# Patient Record
Sex: Female | Born: 1980 | Race: White | Hispanic: No | Smoking: Never smoker
Health system: Southern US, Community
[De-identification: ages and names within clinical notes are randomized; demographics above are authoritative.]

## PROBLEM LIST (undated history)

## (undated) DIAGNOSIS — IMO0002 Reserved for concepts with insufficient information to code with codable children: Secondary | ICD-10-CM

## (undated) DIAGNOSIS — Z789 Other specified health status: Secondary | ICD-10-CM

## (undated) DIAGNOSIS — M519 Unspecified thoracic, thoracolumbar and lumbosacral intervertebral disc disorder: Secondary | ICD-10-CM

## (undated) DIAGNOSIS — R87619 Unspecified abnormal cytological findings in specimens from cervix uteri: Secondary | ICD-10-CM

## (undated) HISTORY — DX: Other specified health status: Z78.9

## (undated) HISTORY — DX: Unspecified abnormal cytological findings in specimens from cervix uteri: R87.619

## (undated) HISTORY — DX: Reserved for concepts with insufficient information to code with codable children: IMO0002

## (undated) HISTORY — DX: Unspecified thoracic, thoracolumbar and lumbosacral intervertebral disc disorder: M51.9

## (undated) HISTORY — PX: LEEP: SHX91

---

## 2005-11-28 ENCOUNTER — Other Ambulatory Visit: Admission: RE | Admit: 2005-11-28 | Discharge: 2005-11-28 | Payer: Self-pay | Admitting: Obstetrics and Gynecology

## 2006-05-18 ENCOUNTER — Ambulatory Visit: Payer: Self-pay | Admitting: Internal Medicine

## 2007-01-19 ENCOUNTER — Ambulatory Visit: Payer: Self-pay | Admitting: Endocrinology

## 2007-05-19 ENCOUNTER — Encounter: Payer: Self-pay | Admitting: *Deleted

## 2007-05-19 DIAGNOSIS — F411 Generalized anxiety disorder: Secondary | ICD-10-CM

## 2007-05-19 DIAGNOSIS — N390 Urinary tract infection, site not specified: Secondary | ICD-10-CM | POA: Insufficient documentation

## 2007-06-03 ENCOUNTER — Encounter: Payer: Self-pay | Admitting: Internal Medicine

## 2007-07-16 ENCOUNTER — Telehealth (INDEPENDENT_AMBULATORY_CARE_PROVIDER_SITE_OTHER): Payer: Self-pay | Admitting: *Deleted

## 2007-07-20 ENCOUNTER — Telehealth (INDEPENDENT_AMBULATORY_CARE_PROVIDER_SITE_OTHER): Payer: Self-pay | Admitting: *Deleted

## 2007-07-20 ENCOUNTER — Ambulatory Visit: Payer: Self-pay | Admitting: Internal Medicine

## 2007-07-20 DIAGNOSIS — G4710 Hypersomnia, unspecified: Secondary | ICD-10-CM

## 2007-07-20 DIAGNOSIS — R079 Chest pain, unspecified: Secondary | ICD-10-CM

## 2007-07-20 DIAGNOSIS — R002 Palpitations: Secondary | ICD-10-CM | POA: Insufficient documentation

## 2007-07-20 DIAGNOSIS — R5383 Other fatigue: Secondary | ICD-10-CM

## 2007-07-20 DIAGNOSIS — R5381 Other malaise: Secondary | ICD-10-CM | POA: Insufficient documentation

## 2007-07-20 LAB — CONVERTED CEMR LAB
AST: 21 units/L (ref 0–37)
Albumin: 4.1 g/dL (ref 3.5–5.2)
Bilirubin, Direct: 0.1 mg/dL (ref 0.0–0.3)
Calcium: 9.5 mg/dL (ref 8.4–10.5)
Chloride: 106 meq/L (ref 96–112)
Eosinophils Absolute: 0.1 10*3/uL (ref 0.0–0.6)
GFR calc Af Amer: 130 mL/min
GFR calc non Af Amer: 108 mL/min
Lymphocytes Relative: 54.9 % — ABNORMAL HIGH (ref 12.0–46.0)
MCV: 90.1 fL (ref 78.0–100.0)
Monocytes Relative: 8.6 % (ref 3.0–11.0)
Platelets: 242 10*3/uL (ref 150–400)
RBC: 4.17 M/uL (ref 3.87–5.11)
Sodium: 140 meq/L (ref 135–145)
Total Bilirubin: 1 mg/dL (ref 0.3–1.2)
WBC: 4.2 10*3/uL — ABNORMAL LOW (ref 4.5–10.5)

## 2007-07-27 ENCOUNTER — Encounter: Payer: Self-pay | Admitting: Internal Medicine

## 2007-07-27 ENCOUNTER — Ambulatory Visit: Payer: Self-pay

## 2007-08-17 ENCOUNTER — Telehealth: Payer: Self-pay | Admitting: Endocrinology

## 2007-08-17 ENCOUNTER — Telehealth (INDEPENDENT_AMBULATORY_CARE_PROVIDER_SITE_OTHER): Payer: Self-pay | Admitting: *Deleted

## 2008-07-09 ENCOUNTER — Ambulatory Visit: Payer: Self-pay | Admitting: Internal Medicine

## 2008-07-09 LAB — CONVERTED CEMR LAB
Bilirubin Urine: NEGATIVE
Crystals: NEGATIVE
Leukocytes, UA: NEGATIVE
Mucus, UA: NEGATIVE
Specific Gravity, Urine: 1.02 (ref 1.000–1.03)
Urine Glucose: NEGATIVE mg/dL
pH: 6.5 (ref 5.0–8.0)

## 2008-07-10 ENCOUNTER — Encounter: Payer: Self-pay | Admitting: Internal Medicine

## 2008-08-11 ENCOUNTER — Telehealth (INDEPENDENT_AMBULATORY_CARE_PROVIDER_SITE_OTHER): Payer: Self-pay | Admitting: *Deleted

## 2009-01-22 ENCOUNTER — Telehealth: Payer: Self-pay | Admitting: Internal Medicine

## 2009-02-01 IMAGING — CR DG CHEST 2V
2 series · 2 of 2 positions shown · non-contrast
Comparison: None.

CLINICAL DATA: 26-year-old with chest pain. 
 CHEST - 2 VIEW:

[view not recorded (1 of 2)]
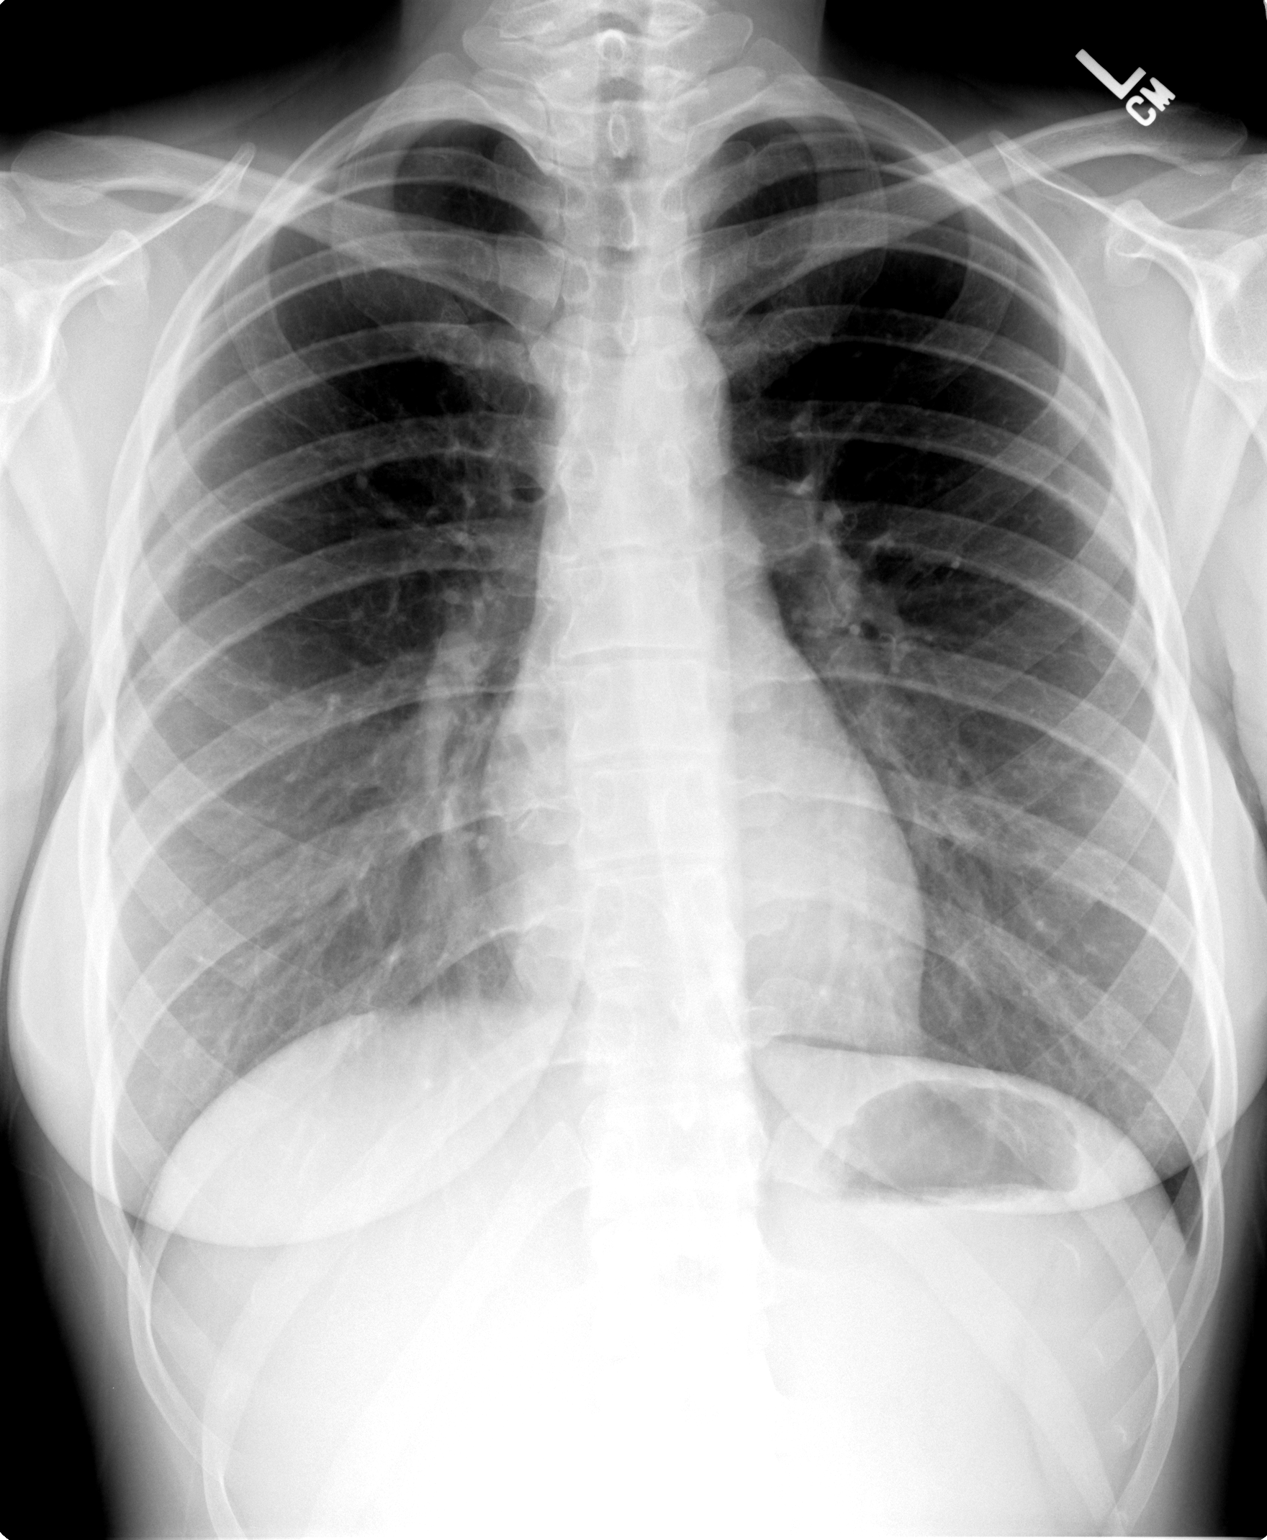

[view not recorded (2 of 2)]
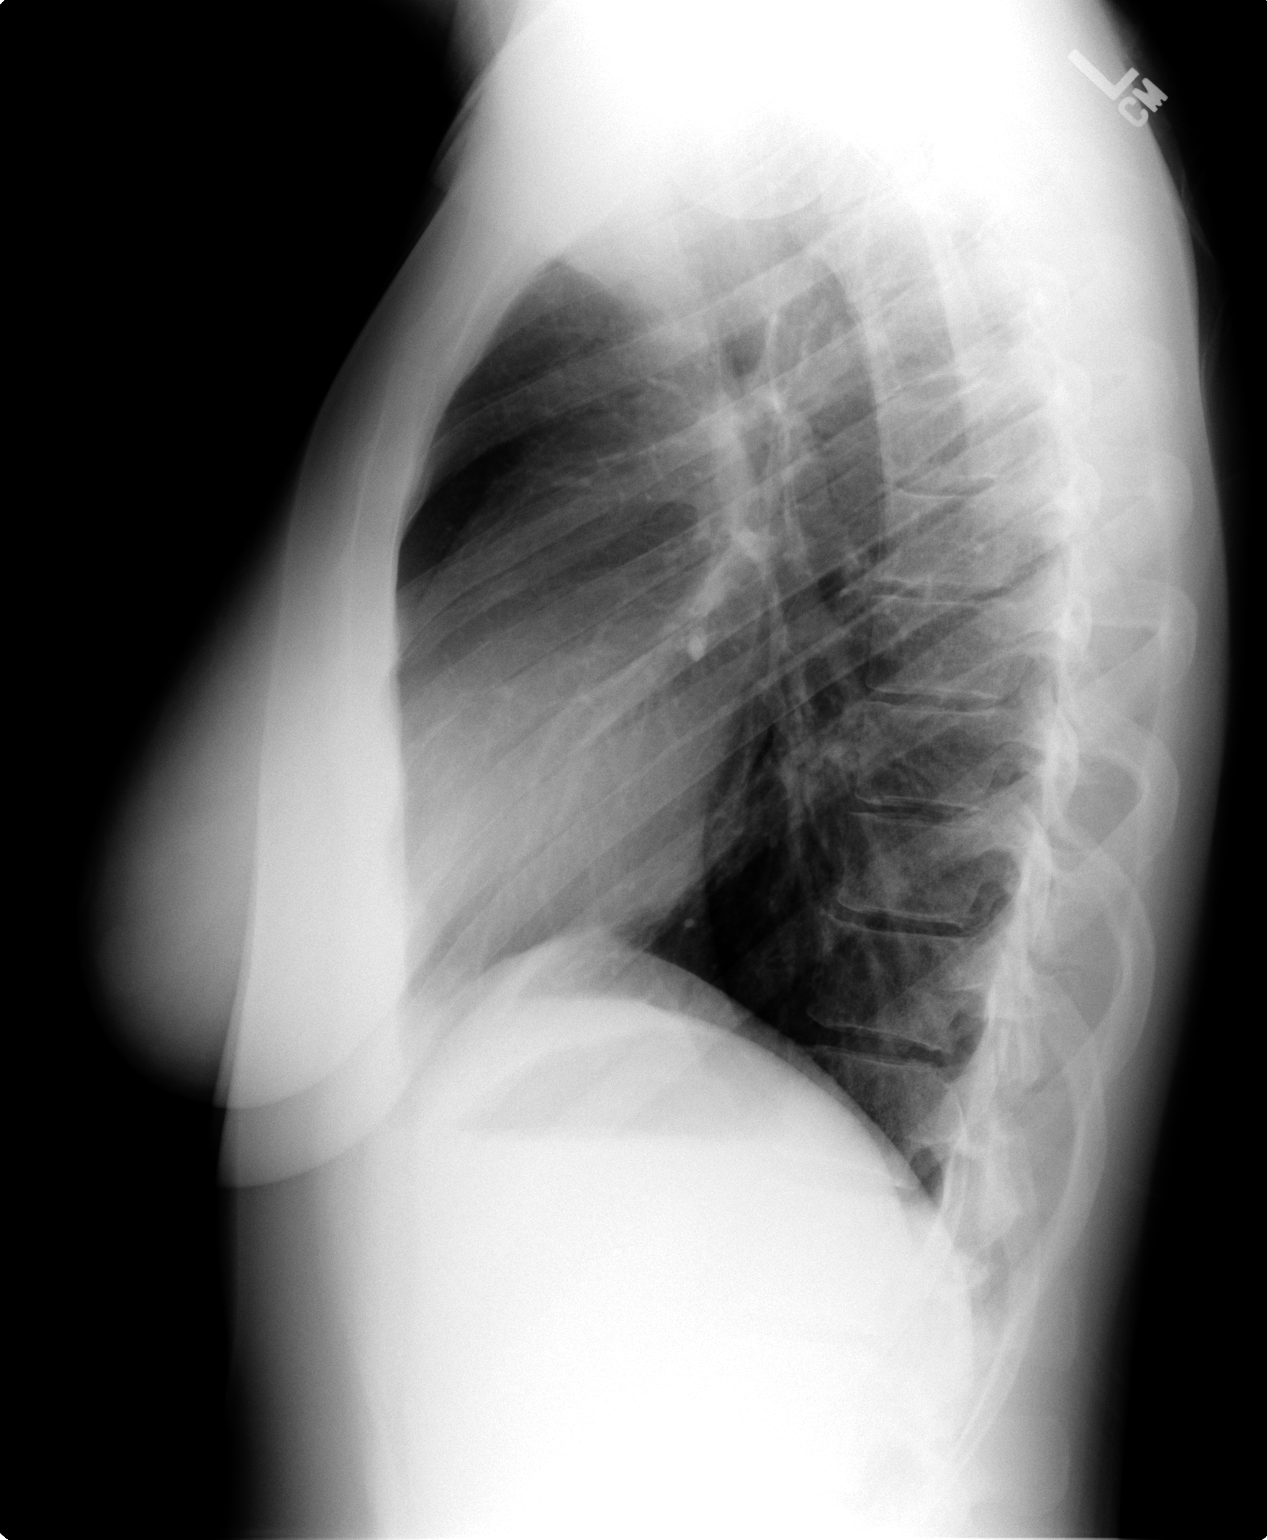

[2 of 2 positions shown; findings below may reference images not displayed]

FINDINGS: The heart size and mediastinal contours are within normal limits.  Both lungs are clear.  The visualized skeletal structures are unremarkable.
IMPRESSION: No active cardiopulmonary disease.

## 2009-08-14 ENCOUNTER — Ambulatory Visit: Payer: Self-pay | Admitting: Internal Medicine

## 2009-08-14 DIAGNOSIS — F988 Other specified behavioral and emotional disorders with onset usually occurring in childhood and adolescence: Secondary | ICD-10-CM | POA: Insufficient documentation

## 2009-08-14 LAB — CONVERTED CEMR LAB
AST: 24 units/L (ref 0–37)
Albumin: 4.2 g/dL (ref 3.5–5.2)
BUN: 6 mg/dL (ref 6–23)
Basophils Relative: 1.6 % (ref 0.0–3.0)
CO2: 29 meq/L (ref 19–32)
Calcium: 9.6 mg/dL (ref 8.4–10.5)
Chloride: 104 meq/L (ref 96–112)
Cholesterol: 195 mg/dL (ref 0–200)
Eosinophils Relative: 1.5 % (ref 0.0–5.0)
HCT: 40.6 % (ref 36.0–46.0)
HDL: 52 mg/dL (ref >39.00)
Hemoglobin: 13.6 g/dL (ref 12.0–15.0)
Lymphocytes Relative: 49.7 % — ABNORMAL HIGH (ref 12.0–46.0)
Lymphs Abs: 1.9 10*3/uL (ref 0.7–4.0)
Neutro Abs: 1.5 10*3/uL (ref 1.4–7.7)
Neutrophils Relative %: 36.8 % — ABNORMAL LOW (ref 43.0–77.0)
Nitrite: NEGATIVE
RDW: 11.6 % (ref 11.5–14.6)
Sodium: 138 meq/L (ref 135–145)
Specific Gravity, Urine: 1.02 (ref 1.000–1.030)
Total CHOL/HDL Ratio: 4
Total Protein, Urine: NEGATIVE mg/dL
Urine Glucose: NEGATIVE mg/dL
VLDL: 9.8 mg/dL (ref 0.0–40.0)
WBC: 4 10*3/uL — ABNORMAL LOW (ref 4.5–10.5)
pH: 6 (ref 5.0–8.0)

## 2009-08-17 ENCOUNTER — Encounter: Payer: Self-pay | Admitting: Internal Medicine

## 2009-08-17 ENCOUNTER — Telehealth: Payer: Self-pay | Admitting: Internal Medicine

## 2009-09-11 ENCOUNTER — Telehealth: Payer: Self-pay | Admitting: Internal Medicine

## 2009-10-16 ENCOUNTER — Telehealth: Payer: Self-pay | Admitting: Internal Medicine

## 2010-01-19 ENCOUNTER — Telehealth: Payer: Self-pay | Admitting: Internal Medicine

## 2010-04-20 ENCOUNTER — Telehealth: Payer: Self-pay | Admitting: Internal Medicine

## 2010-04-24 ENCOUNTER — Emergency Department (HOSPITAL_COMMUNITY): Admission: EM | Admit: 2010-04-24 | Discharge: 2010-04-24 | Payer: Self-pay | Admitting: Emergency Medicine

## 2010-07-16 ENCOUNTER — Ambulatory Visit: Payer: Self-pay | Admitting: Internal Medicine

## 2010-07-16 DIAGNOSIS — R1013 Epigastric pain: Secondary | ICD-10-CM

## 2010-08-25 ENCOUNTER — Encounter: Payer: Self-pay | Admitting: Internal Medicine

## 2010-09-17 LAB — HEPATITIS B SURFACE ANTIGEN: Hepatitis B Surface Ag: NEGATIVE

## 2010-09-17 LAB — RUBELLA ANTIBODY, IGM: Rubella: IMMUNE

## 2010-09-17 LAB — RPR: RPR: NONREACTIVE

## 2010-09-28 NOTE — Progress Notes (Signed)
Summary: Adderall  Phone Note Call from Patient Call back at Home Phone [phone]   Caller: Patient Summary of Call: pt called requesting 3 mth refill of Adderall. Pt informed MD out of office, will wait until tomorrow. Initial call taken by: Margaret Pyle, CMA,  Jan 19, 2010 1:13 PM    New/Updated Medications: ADDERALL XR 20 MG XR24H-CAP (AMPHETAMINE-DEXTROAMPHETAMINE) 1 by mouth once daily - to fill Jan 19, 2010 ADDERALL XR 20 MG XR24H-CAP (AMPHETAMINE-DEXTROAMPHETAMINE) 1 by mouth once daily - to fill February 18, 2010 ADDERALL XR 20 MG XR24H-CAP (AMPHETAMINE-DEXTROAMPHETAMINE) 1 by mouth once daily - to fill March 20, 2010 Prescriptions: ADDERALL XR 20 MG XR24H-CAP (AMPHETAMINE-DEXTROAMPHETAMINE) 1 by mouth once daily - to fill March 20, 2010  #30 x 0   Entered and Authorized by:   Corwin Levins MD   Signed by:   Corwin Levins MD on 01/19/2010   Method used:   Print then Give to Patient   RxID:   [id number] ADDERALL XR 20 MG XR24H-CAP (AMPHETAMINE-DEXTROAMPHETAMINE) 1 by mouth once daily - to fill February 18, 2010  #30 x 0   Entered and Authorized by:   Corwin Levins MD   Signed by:   Corwin Levins MD on 01/19/2010   Method used:   Print then Give to Patient   RxID:   [id number] ADDERALL XR 20 MG XR24H-CAP (AMPHETAMINE-DEXTROAMPHETAMINE) 1 by mouth once daily - to fill Jan 19, 2010  #30 x 0   Entered and Authorized by:   Corwin Levins MD   Signed by:   Corwin Levins MD on 01/19/2010   Method used:   Print then Give to Patient   RxID:   [id number]  done hardcopy to LIM side B - dahlia Corwin Levins MD  Jan 19, 2010 2:11 PM   pt informed, Rx in cabinet for pt pick up Margaret Pyle, CMA  Jan 19, 2010 2:47 PM

## 2010-09-28 NOTE — Progress Notes (Signed)
Summary: Adderall  Phone Note Call from Patient Call back at Home Phone [phone]   Caller: Patient Summary of Call: pt called requesting refills of Adderall ER supply Initial call taken by: Margaret Pyle, CMA,  October 16, 2009 1:25 PM  Follow-up for Phone Call        done hardcopy to LIM side B - dahlia  - x 3 mo Follow-up by: Corwin Levins MD,  October 16, 2009 1:29 PM  Additional Follow-up for Phone Call Additional follow up Details #1::        pt informed via VM, told to call back if needed, rx in cabinet for pick up Additional Follow-up by: Margaret Pyle, CMA,  October 16, 2009 1:49 PM    New/Updated Medications: ADDERALL XR 20 MG XR24H-CAP (AMPHETAMINE-DEXTROAMPHETAMINE) 1 by mouth once daily - to fill Oct 15, 2009 ADDERALL XR 20 MG XR24H-CAP (AMPHETAMINE-DEXTROAMPHETAMINE) 1 by mouth once daily - to fill Nov 14, 2009 ADDERALL XR 20 MG XR24H-CAP (AMPHETAMINE-DEXTROAMPHETAMINE) 1 by mouth once daily - to fill Dec 14, 2009 Prescriptions: ADDERALL XR 20 MG XR24H-CAP (AMPHETAMINE-DEXTROAMPHETAMINE) 1 by mouth once daily - to fill Dec 14, 2009  #30 x 0   Entered and Authorized by:   Corwin Levins MD   Signed by:   Corwin Levins MD on 10/16/2009   Method used:   Print then Give to Patient   RxID:   [id number] ADDERALL XR 20 MG XR24H-CAP (AMPHETAMINE-DEXTROAMPHETAMINE) 1 by mouth once daily - to fill Nov 14, 2009  #30 x 0   Entered and Authorized by:   Corwin Levins MD   Signed by:   Corwin Levins MD on 10/16/2009   Method used:   Print then Give to Patient   RxID:   [id number] ADDERALL XR 20 MG XR24H-CAP (AMPHETAMINE-DEXTROAMPHETAMINE) 1 by mouth once daily - to fill Oct 15, 2009  #30 x 0   Entered and Authorized by:   Corwin Levins MD   Signed by:   Corwin Levins MD on 10/16/2009   Method used:   Print then Give to Patient   RxID:   [id number]

## 2010-09-28 NOTE — Progress Notes (Signed)
Summary: Med refill  Phone Note Refill Request Message from:  Patient on September 11, 2009 4:10 PM  Refills Requested: Medication #1:  ADDERALL XR 20 MG XR24H-CAP 1 by mouth once daily.   Dosage confirmed as above?Dosage Confirmed Pt states she was supposed to call back and let MD know if Medication helped pt. Pt states it is going well and "helps her out alot". She would like a refill sent into [facility] on [address]? Please advise?  Initial call taken by: Josph Macho CMA,  September 11, 2009 4:11 PM  Follow-up for Phone Call        done hardcopy to LIM side B - dahlia  Follow-up by: Corwin Levins MD,  September 11, 2009 5:48 PM  Additional Follow-up for Phone Call Additional follow up Details #1::        left message on machine for pt to return my call, rx in cabinet for pt pick up Additional Follow-up by: Margaret Pyle, CMA,  September 14, 2009 8:10 AM    Additional Follow-up for Phone Call Additional follow up Details #2::    left message on machine for pt to return my call.  Margaret Pyle, CMA  September 14, 2009 4:26 PM   left message on machine for pt to return my call, closing phone note until further contact from pt. Follow-up by: Margaret Pyle, CMA,  September 15, 2009 1:24 PM  New/Updated Medications: ADDERALL XR 20 MG XR24H-CAP (AMPHETAMINE-DEXTROAMPHETAMINE) 1 by mouth once daily - to fill Sep 13, 2009 Prescriptions: ADDERALL XR 20 MG XR24H-CAP (AMPHETAMINE-DEXTROAMPHETAMINE) 1 by mouth once daily - to fill Sep 13, 2009  #30 x 0   Entered and Authorized by:   Corwin Levins MD   Signed by:   Corwin Levins MD on 09/11/2009   Method used:   Print then Give to Patient   RxID:   [id number]

## 2010-09-28 NOTE — Progress Notes (Signed)
Summary: Adderall  Phone Note Call from Patient Call back at Home Phone [phone]   Caller: Patient Summary of Call: Pt called requesting 3 mth refill of Adderall XR Initial call taken by: Margaret Pyle, CMA,  April 20, 2010 8:49 AM  Follow-up for Phone Call        done hardcopy to LIM side B - dahlia  Follow-up by: Corwin Levins MD,  April 20, 2010 12:21 PM  Additional Follow-up for Phone Call Additional follow up Details #1::        Pt informed via VM, Rx in cabinet for pt pick up Additional Follow-up by: Margaret Pyle, CMA,  April 20, 2010 1:11 PM    New/Updated Medications: ADDERALL XR 20 MG XR24H-CAP (AMPHETAMINE-DEXTROAMPHETAMINE) 1 by mouth once daily - to fill Apr 20, 2010 ADDERALL XR 20 MG XR24H-CAP (AMPHETAMINE-DEXTROAMPHETAMINE) 1 by mouth once daily - to fill sept 22, 2011 ADDERALL XR 20 MG XR24H-CAP (AMPHETAMINE-DEXTROAMPHETAMINE) 1 by mouth once daily - to fill Jun 19, 2010 Prescriptions: ADDERALL XR 20 MG XR24H-CAP (AMPHETAMINE-DEXTROAMPHETAMINE) 1 by mouth once daily - to fill Jun 19, 2010  #30 x 0   Entered and Authorized by:   Corwin Levins MD   Signed by:   Corwin Levins MD on 04/20/2010   Method used:   Print then Give to Patient   RxID:   [id number] ADDERALL XR 20 MG XR24H-CAP (AMPHETAMINE-DEXTROAMPHETAMINE) 1 by mouth once daily - to fill sept 22, 2011  #30 x 0   Entered and Authorized by:   Corwin Levins MD   Signed by:   Corwin Levins MD on 04/20/2010   Method used:   Print then Give to Patient   RxID:   [id number] ADDERALL XR 20 MG XR24H-CAP (AMPHETAMINE-DEXTROAMPHETAMINE) 1 by mouth once daily - to fill Apr 20, 2010  #30 x 0   Entered and Authorized by:   Corwin Levins MD   Signed by:   Corwin Levins MD on 04/20/2010   Method used:   Print then Give to Patient   RxID:   [id number]

## 2010-09-28 NOTE — Assessment & Plan Note (Signed)
Summary: STOMACH PAIN-- NAUSEA AFTER EATING -STC   Vital Signs:  Patient profile:   30 year old female Height:      72 inches Weight:      187 pounds BMI:     25.45 O2 Sat:      99 % on Room air Temp:     97.9 degrees F oral Pulse rate:   76 / minute BP sitting:   104 / 70  (left arm) Cuff size:   regular  Vitals Entered By: Zella Ball Ewing CMA Duncan Dull) (July 16, 2010 3:51 PM)  O2 Flow:  Room air CC: Stomach pain, refills/RE   CC:  Stomach pain and refills/RE.  History of Present Illness: here with c/o 1 wk unusual mild to mod dull epipgastric discomfort without overt dysphagia, vomiting, bowel change, fever or blood, but has nausea and hunger every two hours, eating seems to help the discomfort but has to eat every two hrs,  has taken mult TUMS without much relief though.  No prior hx of this, no radiation of discomfort to the back , no other aggrevating or alleviating such as position change or other otc med use., or relation to GU function.  LMP 5 days, home pregnancy test neg.  Overall good med compalicne and tolerance, including adderall and xanax which seem adequate for her chronic ADD and anxiety symtpoms;  is currently functioning well with new marriage or 4 months, socailly and work as well.  Denies worsening depressive symptoms, suicidal ideation, or panic.    Preventive Screening-Counseling & Management      Drug Use:  no.    Problems Prior to Update: 1)  Epigastric Pain  (ICD-789.06) 2)  Preventive Health Care  (ICD-V70.0) 3)  Add  (ICD-314.00) 4)  Hypersomnia  (ICD-780.54) 5)  Palpitations  (ICD-785.1) 6)  Fatigue  (ICD-780.79) 7)  Chest Pain  (ICD-786.50) 8)  Family History Diabetes 1st Degree Relative  (ICD-V18.0) 9)  Family History Breast Cancer 1st Degree Relative <50  (ICD-V16.3) 10)  Uti  (ICD-599.0) 11)  Anxiety  (ICD-300.00)  Medications Prior to Update: 1)  Alprazolam 0.5 Mg Tabs (Alprazolam) .... Take 1 Tablet By Mouth Twice A Day As Needed Nerves 2)   Adderall Xr 20 Mg Xr24h-Cap (Amphetamine-Dextroamphetamine) .Marland Kitchen.. 1 By Mouth Once Daily - To Fill Jun 19, 2010  Current Medications (verified): 1)  Alprazolam 0.5 Mg Tabs (Alprazolam) .... Take 1 Tablet By Mouth Twice A Day As Needed Nerves 2)  Adderall Xr 20 Mg Xr24h-Cap (Amphetamine-Dextroamphetamine) .Marland Kitchen.. 1 By Mouth Once Daily - To Fill Sep 17, 2009 3)  Nexium 40 Mg Cpdr (Esomeprazole Magnesium) .Marland Kitchen.. 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  Drug use-no Drug Use:  no  Review of Systems       all otherwise negative per pt -    Physical Exam  General:  alert and overweight-appearing.   Head:  normocephalic and atraumatic.   Eyes:  vision grossly intact, pupils equal, and pupils round.   Ears:  R ear normal and L ear normal.   Nose:  no external deformity and no nasal discharge.   Mouth:  no gingival abnormalities and pharynx pink and moist.   Neck:  supple and no masses.   Lungs:  normal respiratory effort and normal breath sounds.   Heart:  normal rate and regular rhythm.   Abdomen:  soft and normal bowel sounds.  with epigastric tedner without guarding or reboun Extremities:  no edema, no erythema    Impression & Recommendations:  Problem # 1:  EPIGASTRIC PAIN (ICD-789.06) with mild to mod tender as above, suspect  peptic related issue ; cant r/o gastritis or PUD;  no warning symtpoms at this time; will give 4 wks nexium with two times a day use for 3 days, then once daily after that;  if not improved over the weekend would consider GB u/s, labs includiing h pylori and/ or GI consult for ?egd as well   Problem # 2:  ADD (ICD-314.00) stable overall by hx and exam, ok to continue meds/tx as is - refills done today  Problem # 3:  ANXIETY (ICD-300.00)  Her updated medication list for this problem includes:    Alprazolam 0.5 Mg Tabs (Alprazolam) .Marland Kitchen... Take 1 tablet by mouth twice a day as needed nerves stable overall by hx and exam, ok to continue meds/tx as is   Complete Medication List: 1)  Alprazolam 0.5 Mg Tabs (Alprazolam) .... Take 1 tablet by mouth twice a day as needed nerves 2)  Adderall Xr 20 Mg Xr24h-cap (Amphetamine-dextroamphetamine) .Marland Kitchen.. 1 by mouth once daily - to fill Sep 17, 2009 3)  Nexium 40 Mg Cpdr (Esomeprazole magnesium) .Marland Kitchen.. 1po once daily  Patient Instructions: 1)  Please take all new medications as sampled - two times a day nexium for 3 days, then once per day for the next month 2)  If not improved over the weekend, please call as we will need lab work and possibly gallbladder ultrasound 3)  Continue all previous medications as before this visit (you are given the refills today) 4)  Please schedule a follow-up appointment in 1 year or sooner if needed Prescriptions: ADDERALL XR 20 MG XR24H-CAP (AMPHETAMINE-DEXTROAMPHETAMINE) 1 by mouth once daily - to fill Sep 17, 2009  #30 x 0   Entered and Authorized by:   Corwin Levins MD   Signed by:   Corwin Levins MD on 07/16/2010   Method used:   Print then Give to Patient   RxID:   [id number] ADDERALL XR 20 MG XR24H-CAP (AMPHETAMINE-DEXTROAMPHETAMINE) 1 by mouth once daily - to fill Aug 18, 2010  #30 x 0   Entered and Authorized by:   Corwin Levins MD   Signed by:   Corwin Levins MD on 07/16/2010   Method used:   Print then Give to  Patient   RxID:   [id number] ADDERALL XR 20 MG XR24H-CAP (AMPHETAMINE-DEXTROAMPHETAMINE) 1 by mouth once daily - to fill Jul 19, 2010  #30 x 0   Entered and Authorized by:   Corwin Levins MD   Signed by:   Corwin Levins MD on 07/16/2010   Method used:   Print then Give to Patient   RxID:   [id number] ALPRAZOLAM 0.5 MG TABS (ALPRAZOLAM) Take 1 tablet by mouth twice a day as needed nerves  #  60 x 5   Entered and Authorized by:   Corwin Levins MD   Signed by:   Corwin Levins MD on 07/16/2010   Method used:   Print then Give to Patient   RxID:   [id number]    Orders Added: 1)  Est. Patient Level IV [51761]

## 2010-09-30 NOTE — Medication Information (Signed)
Summary: Approval/medco  Approval/medco   Imported By: Lester Walnut 09/01/2010 08:25:22  _____________________________________________________________________  External Attachment:    Type:   Image     Comment:   External Document

## 2010-11-12 LAB — DIFFERENTIAL
Lymphs Abs: 2.1 10*3/uL (ref 0.7–4.0)
Monocytes Relative: 8 % (ref 3–12)
Neutro Abs: 4.2 10*3/uL (ref 1.7–7.7)
Neutrophils Relative %: 60 % (ref 43–77)

## 2010-11-12 LAB — RPR: RPR Ser Ql: NONREACTIVE

## 2010-11-12 LAB — URINALYSIS, ROUTINE W REFLEX MICROSCOPIC
Nitrite: NEGATIVE
Specific Gravity, Urine: 1.006 (ref 1.005–1.030)
pH: 7.5 (ref 5.0–8.0)

## 2010-11-12 LAB — CBC
Hemoglobin: 11.9 g/dL — ABNORMAL LOW (ref 12.0–15.0)
RBC: 3.83 MIL/uL — ABNORMAL LOW (ref 3.87–5.11)
WBC: 7 10*3/uL (ref 4.0–10.5)

## 2010-11-12 LAB — WET PREP, GENITAL

## 2010-11-12 LAB — GC/CHLAMYDIA PROBE AMP, GENITAL
Chlamydia, DNA Probe: NEGATIVE
GC Probe Amp, Genital: NEGATIVE

## 2010-11-12 LAB — POCT I-STAT, CHEM 8
BUN: 7 mg/dL (ref 6–23)
Creatinine, Ser: 0.6 mg/dL (ref 0.4–1.2)
Hemoglobin: 13.3 g/dL (ref 12.0–15.0)
Potassium: 3.9 mEq/L (ref 3.5–5.1)
Sodium: 140 mEq/L (ref 135–145)

## 2011-04-14 ENCOUNTER — Inpatient Hospital Stay (HOSPITAL_COMMUNITY)
Admission: AD | Admit: 2011-04-14 | Discharge: 2011-04-14 | Disposition: A | Discharge status: Home / Self Care | Payer: BC Managed Care – PPO | Source: Ambulatory Visit | Attending: Obstetrics and Gynecology | Admitting: Obstetrics and Gynecology

## 2011-04-14 ENCOUNTER — Encounter (HOSPITAL_COMMUNITY): Payer: Self-pay | Admitting: Obstetrics and Gynecology

## 2011-04-14 ENCOUNTER — Encounter (HOSPITAL_COMMUNITY): Payer: Self-pay | Admitting: Anesthesiology

## 2011-04-14 ENCOUNTER — Telehealth (HOSPITAL_COMMUNITY): Payer: Self-pay | Admitting: *Deleted

## 2011-04-14 ENCOUNTER — Encounter (HOSPITAL_COMMUNITY): Payer: Self-pay | Admitting: *Deleted

## 2011-04-14 ENCOUNTER — Inpatient Hospital Stay (HOSPITAL_COMMUNITY)
Admission: AD | Admit: 2011-04-14 | Discharge: 2011-04-16 | DRG: 373 | Disposition: A | Discharge status: Home / Self Care | Payer: BC Managed Care – PPO | Source: Ambulatory Visit | Attending: Obstetrics and Gynecology | Admitting: Obstetrics and Gynecology

## 2011-04-14 ENCOUNTER — Inpatient Hospital Stay (HOSPITAL_COMMUNITY): Payer: BC Managed Care – PPO | Admitting: Anesthesiology

## 2011-04-14 DIAGNOSIS — O471 False labor at or after 37 completed weeks of gestation: Secondary | ICD-10-CM

## 2011-04-14 DIAGNOSIS — IMO0001 Reserved for inherently not codable concepts without codable children: Secondary | ICD-10-CM

## 2011-04-14 DIAGNOSIS — O479 False labor, unspecified: Secondary | ICD-10-CM | POA: Insufficient documentation

## 2011-04-14 LAB — CBC
MCH: 31 pg (ref 26.0–34.0)
MCHC: 34.8 g/dL (ref 30.0–36.0)
MCV: 88.9 fL (ref 78.0–100.0)
Platelets: 229 10*3/uL (ref 150–400)
RDW: 12.6 % (ref 11.5–15.5)

## 2011-04-14 MED ORDER — PHENYLEPHRINE 40 MCG/ML (10ML) SYRINGE FOR IV PUSH (FOR BLOOD PRESSURE SUPPORT)
80.0000 ug | PREFILLED_SYRINGE | INTRAVENOUS | Status: DC | PRN
  Filled 2011-04-14: qty 2

## 2011-04-14 MED ORDER — EPHEDRINE 5 MG/ML INJ
10.0000 mg | INTRAVENOUS | Status: DC | PRN
  Filled 2011-04-14: qty 2
  Filled 2011-04-14: qty 4

## 2011-04-14 MED ORDER — OXYTOCIN 20 UNITS IN LACTATED RINGERS INFUSION - SIMPLE: 125.0000 mL/h | INTRAVENOUS | Status: AC

## 2011-04-14 MED ORDER — LACTATED RINGERS IV SOLN
INTRAVENOUS | Status: DC
  Administered 2011-04-14 (×2): via INTRAVENOUS

## 2011-04-14 MED ORDER — OXYTOCIN BOLUS FROM INFUSION
500.0000 mL | Freq: Once | INTRAVENOUS | Status: DC
  Administered 2011-04-14: 500 mL via INTRAVENOUS
  Filled 2011-04-14: qty 500
  Filled 2011-04-14: qty 1000

## 2011-04-14 MED ORDER — IBUPROFEN 600 MG PO TABS: 600.0000 mg | ORAL_TABLET | Freq: Four times a day (QID) | ORAL | Status: DC | PRN

## 2011-04-14 MED ORDER — OXYCODONE-ACETAMINOPHEN 5-325 MG PO TABS: 2.0000 | ORAL_TABLET | ORAL | Status: DC | PRN

## 2011-04-14 MED ORDER — ONDANSETRON HCL 4 MG/2ML IJ SOLN: 4.0000 mg | Freq: Four times a day (QID) | INTRAMUSCULAR | Status: DC | PRN

## 2011-04-14 MED ORDER — FLEET ENEMA 7-19 GM/118ML RE ENEM: 1.0000 | ENEMA | RECTAL | Status: DC | PRN

## 2011-04-14 MED ORDER — ACETAMINOPHEN 325 MG PO TABS: 650.0000 mg | ORAL_TABLET | ORAL | Status: DC | PRN

## 2011-04-14 MED ORDER — DIPHENHYDRAMINE HCL 50 MG/ML IJ SOLN: 12.5000 mg | INTRAMUSCULAR | Status: DC | PRN

## 2011-04-14 MED ORDER — LIDOCAINE HCL 1.5 % IJ SOLN
INTRAMUSCULAR | Status: DC | PRN
  Administered 2011-04-14 (×2): 4 mL via EPIDURAL

## 2011-04-14 MED ORDER — NALBUPHINE SYRINGE 5 MG/0.5 ML: 5.0000 mg | INJECTION | INTRAMUSCULAR | Status: DC | PRN

## 2011-04-14 MED ORDER — HYDROXYZINE HCL 50 MG/ML IM SOLN: 50.0000 mg | Freq: Four times a day (QID) | INTRAMUSCULAR | Status: DC | PRN

## 2011-04-14 MED ORDER — FENTANYL 2.5 MCG/ML BUPIVACAINE 1/10 % EPIDURAL INFUSION (WH - ANES)
14.0000 mL/h | INTRAMUSCULAR | Status: DC
  Administered 2011-04-14 – 2011-04-15 (×3): 14 mL/h via EPIDURAL
  Filled 2011-04-14 (×2): qty 60

## 2011-04-14 MED ORDER — LACTATED RINGERS IV SOLN: 500.0000 mL | Freq: Once | INTRAVENOUS | Status: DC

## 2011-04-14 MED ORDER — LACTATED RINGERS IV SOLN: 500.0000 mL | INTRAVENOUS | Status: DC | PRN

## 2011-04-14 MED ORDER — CITRIC ACID-SODIUM CITRATE 334-500 MG/5ML PO SOLN: 30.0000 mL | ORAL | Status: DC | PRN

## 2011-04-14 MED ORDER — PHENYLEPHRINE 40 MCG/ML (10ML) SYRINGE FOR IV PUSH (FOR BLOOD PRESSURE SUPPORT)
80.0000 ug | PREFILLED_SYRINGE | INTRAVENOUS | Status: DC | PRN
  Filled 2011-04-14: qty 5
  Filled 2011-04-14: qty 2

## 2011-04-14 MED ORDER — EPHEDRINE 5 MG/ML INJ
10.0000 mg | INTRAVENOUS | Status: DC | PRN
  Filled 2011-04-14: qty 2

## 2011-04-14 MED ORDER — LIDOCAINE HCL (PF) 1 % IJ SOLN
30.0000 mL | INTRAMUSCULAR | Status: DC | PRN
  Filled 2011-04-14: qty 30

## 2011-04-14 MED ORDER — HYDROXYZINE HCL 50 MG PO TABS
50.0000 mg | ORAL_TABLET | Freq: Four times a day (QID) | ORAL | Status: DC | PRN
  Filled 2011-04-14: qty 1

## 2011-04-14 NOTE — Progress Notes (Signed)
Ctx started around 2200, worse and closer now.  40+wks, g1.  No rom or vaginal bleeding

## 2011-04-14 NOTE — Progress Notes (Signed)
PT SAYS WAS IN OFFICE ON Monday- SAID STARTING TO DILATE-.  TONIGHT HURT BAD  AT 10PM- THEN AT 0230- CLOSED. DENIES MRSA AND HSV.

## 2011-04-14 NOTE — Anesthesia Procedure Notes (Signed)
Epidural Patient location during procedure: OB Start time: 04/14/2011 9:30 PM  Staffing Anesthesiologist: Maleena Eddleman A. Performed by: anesthesiologist   Preanesthetic Checklist Completed: patient identified, site marked, surgical consent, pre-op evaluation, timeout performed, IV checked, risks and benefits discussed and monitors and equipment checked  Epidural Patient position: sitting Prep: site prepped and draped and DuraPrep Patient monitoring: continuous pulse ox and blood pressure Approach: midline Injection technique: LOR air  Needle:  Needle type: Tuohy  Needle gauge: 17 G Needle length: 9 cm Needle insertion depth: 7 cm Catheter type: closed end flexible Catheter size: 19 Gauge Catheter at skin depth: 12 cm Test dose: negative and 1.5% lidocaine  Assessment Events: blood not aspirated, injection not painful, no injection resistance, negative IV test and no paresthesia  Additional Notes Patient is more comfortable after epidural dosed. Please see RN's note for documentation of vital signs and FHR which are stable.

## 2011-04-14 NOTE — Anesthesia Preprocedure Evaluation (Signed)
Anesthesia Evaluation  Name, MR# and DOB Patient awake  General Assessment Comment  Reviewed: Allergy & Precautions, H&P , Patient's Chart, lab work & pertinent test results  Airway Mallampati: III TM Distance: >3 FB Neck ROM: Full    Dental No notable dental hx. (+) Teeth Intact   Pulmonary  clear to auscultation  pulmonary exam normalPulmonary Exam Normal breath sounds clear to auscultation none    Cardiovascular Regular Normal    Neuro/Psych    (+) Anxiety,  Negative Neurological ROS  Negative Psych ROS  GI/Hepatic/Renal negative GI ROS, negative Liver ROS, and negative Renal ROS (+)       Endo/Other  Negative Endocrine ROS (+)      Abdominal Normal abdominal exam  (+)   Musculoskeletal negative musculoskeletal ROS (+)   Hematology negative hematology ROS (+)   Peds  Reproductive/Obstetrics negative OB ROS    Anesthesia Other Findings             Anesthesia Physical Anesthesia Plan  ASA: II  Anesthesia Plan: Epidural   Post-op Pain Management:    Induction:   Airway Management Planned:   Additional Equipment:   Intra-op Plan:   Post-operative Plan:   Informed Consent: I have reviewed the patients History and Physical, chart, labs and discussed the procedure including the risks, benefits and alternatives for the proposed anesthesia with the patient or authorized representative who has indicated his/her understanding and acceptance.     Plan Discussed with: Anesthesiologist  Anesthesia Plan Comments:         Anesthesia Quick Evaluation

## 2011-04-14 NOTE — H&P (Signed)
Alexis Hoover is a 30 y.o. MW female at 40.6 weeks per [facility] 04/07/11 presenting for labor check.  Seen in MAU around 5am and discharged home secondary to cx=1/90/-1.   Seen at office for scheduled visit & u/s earlier today.  Plans to Breastfeed. Maternal Medical History:  Reason for admission: Reason for admission: contractions and nausea.  Contractions: Onset was yesterday.   Frequency: regular.   Perceived severity is strong.    Fetal activity: Perceived fetal activity is normal.   Last perceived fetal movement was within the past hour.    Prenatal complications: 1.  Polyhydramnios second trimester--since resolved 2.  Resolved LVEIF & ventriculomegaly     OB History    Grav Para Term Preterm Abortions TAB SAB Ect Mult Living   4 0 0 0 2 1 1 0 0 0      Past Medical History  Diagnosis Date  . No pertinent past medical history    Past Surgical History  Procedure Date  . No past surgeries    Family History: family history includes Asthma in her sister; Cancer in her paternal grandfather; Diabetes in her maternal grandmother; Hypertension in her mother; Stroke in her maternal grandmother; and Vision loss in her maternal grandmother. Social History:  reports that she has never smoked. She does not have any smokeless tobacco history on file. She reports that she does not drink alcohol or use illicit drugs. 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   Respiratory: Negative.   Cardiovascular: Negative.   Gastrointestinal: Positive for nausea.       Mild nausea today; no emesis  Genitourinary: Negative.   Skin: Negative.   Neurological: Negative.    Dilation: 5.5 Effacement (%): 100 Station: -1 Exam by:: H. Fraser Busche CNM  Blood pressure 130/62, pulse 69, temperature 98.4 F (36.9 C), temperature source Oral, resp. rate 20, last menstrual period 07/09/2010, unknown if currently breastfeeding. Maternal Exam:  Uterine Assessment: Contraction  strength is moderate.  Contraction frequency is regular.  2-5 minutes  Abdomen: Patient reports no abdominal tenderness. Estimated fetal weight is 8+6 on u/s today at office.   Fetal presentation: vertex  Introitus: Normal vulva. Ferning test: not done.  Nitrazine test: not done.  Pelvis: adequate for delivery.   Cervix: Cervix evaluated by digital exam.   5-6/90/-1; BBOW  Fetal Exam Fetal Monitor Review: Mode: ultrasound.   Baseline rate: 130.  Variability: moderate (6-25 bpm).   Pattern: accelerations present and no decelerations.    Fetal State Assessment: Category I - tracings are normal.     Physical Exam  Constitutional: She is oriented to person, place, and time. She appears well-developed and well-nourished. She appears distressed.       Grimace with ctxs  Cardiovascular: Normal rate and regular rhythm.   Respiratory: Effort normal and breath sounds normal.  GI: Soft. Bowel sounds are normal.       gravid  Musculoskeletal: Normal range of motion. She exhibits edema. She exhibits no tenderness.       Trace generalized edema BLE  Neurological: She is alert and oriented to person, place, and time. She has normal reflexes.  Skin: Skin is warm and dry.  Psychiatric: She has a normal mood and affect. Her behavior is normal. Judgment and thought content normal.    Prenatal labs: ABO, Rh: A/Positive/-- (01/20 0000) Antibody: Negative (01/20 0000) Rubella:  immune RPR: Nonreactive (01/20 0000)  HBsAg: Negative (01/20 0000)  HIV: Non-reactive (01/20 0000)  GBS:  Negative (01/20 0000)   Assessment/Plan: 1.  IUP at 40.6 2.  Active labor 3.  GBS negative 4.  Desires epidural  5.  Cat I FHT  1.  Admit to BS with Dr. Pennie Rushing as attending 2.  Routine L&D orders; epidural ASAP 3.  AROM prn augmentation 4.  MD to follow  Antonietta Breach 04/14/2011, 9:11 PM

## 2011-04-14 NOTE — ED Provider Notes (Signed)
History   Pt is 30yo WF G3P0020 at 40.6 per [facility] 04/07/11 who presents unannounced for labor check.  No bloody show, no lof.  GFM.  Unable to rest very well at home. Followed by MD service at [facility].  At last ROB, per pt's recall, Dr. Stefano Gaul noted that cx was "starting to dilate."  Pt with no significant pregnancy complications. If no spontaneous labor, patient is scheduled for IOL Mon 04/18/11.  Chief Complaint  Patient presents with  . Labor Eval   HPI  OB History    Grav Para Term Preterm Abortions TAB SAB Ect Mult Living   1               No past medical history on file.  No past surgical history on file.  No family history on file.  History  Substance Use Topics  . Smoking status: Not on file  . Smokeless tobacco: Not on file  . Alcohol Use: Not on file    Allergies: No Known Allergies  Prescriptions prior to admission  Medication Sig Dispense Refill  . fish oil-omega-3 fatty acids 1000 MG capsule Take 1 g by mouth daily.        . Prenatal Vit-Fe Fumarate-FA (PRENATAL PLUS) 65-1 MG TABS Take 1 tablet by mouth daily.          Review of Systems  Constitutional: Negative.   Respiratory: Negative.   Cardiovascular: Negative.   Gastrointestinal: Negative.   Genitourinary: Negative.   Skin: Negative.    Physical Exam   Blood pressure 124/83, pulse 91, temperature 97.8 F (36.6 C), temperature source Oral, resp. rate 20, height 5\' 11"  (1.803 m), weight 109.77 kg (242 lb).  Physical Exam  Constitutional: She is oriented to person, place, and time. She appears well-developed and well-nourished.  Cardiovascular: Normal rate and regular rhythm.   Respiratory: Effort normal and breath sounds normal.  GI: Soft. Bowel sounds are normal.  Genitourinary:       cx 1/90/-1; posterior; membranes palpated  Neurological: She is alert and oriented to person, place, and time. She has normal reflexes.  Skin: Skin is warm and dry.  FHR=130's, moderate variability, no decels,  reactive TOCO: irregular ctxs, mild-moderate  MAU Course  Procedures 1.  NST 2.  cx check  Assessment and Plan  1.  IUP at 40.6 2.  Prodromal vs early labor 3.  Reactive FHT after juice and side-lying  1.  D/c home with [facility] and labor precautions 2.  Has appointment at office later today for u/s and ROB 3.  F/u as scheduled or prn  Edouard Gikas H 04/14/2011, 5:22 AM

## 2011-04-14 NOTE — Telephone Encounter (Signed)
Preadmission screen  

## 2011-04-14 NOTE — Plan of Care (Signed)
Problem: Consults Goal: Birthing Suites Patient Information Press F2 to bring up selections list   Pt > [redacted] weeks EGA     

## 2011-04-14 NOTE — Progress Notes (Signed)
Adara Kittle is a 30 y.o. G3P0020 at [redacted]w[redacted]d by LMP admitted for active labor  Subjective:Pt is now comfortable with epidural.     Objective: BP 142/74  Pulse 81  Temp(Src) 98.4 F (36.9 C) (Oral)  Resp 20  SpO2 99%  LMP 07/09/2010  Breastfeeding? Unknown      FHT:  FHR: 140 bpm, variability: moderate,  accelerations:  Present,  decelerations:  Absent UC:   regular, every 3-5 minutes since epidural. SVE:   Dilation: 7.5 Effacement (%): 80 Station: -1 Exam by:: Dr Pennie Rushing  Labs: Lab Results  Component Value Date   WBC 17.2* 04/14/2011   HGB 13.1 04/14/2011   HCT 37.6 04/14/2011   MCV 88.9 04/14/2011   PLT 229 04/14/2011    Assessment / Plan: Spontaneous labor, progressing normally  Labor: Progressing normally Preeclampsia:  no signs or symptoms of toxicity Fetal Wellbeing:  Category I Pain Control:  Epidural I/D:  n/a Anticipated MOD:  NSVD  Nettie Cromwell P 04/14/2011, 10:16 PM

## 2011-04-15 ENCOUNTER — Encounter (HOSPITAL_COMMUNITY): Payer: Self-pay | Admitting: *Deleted

## 2011-04-15 ENCOUNTER — Telehealth (HOSPITAL_COMMUNITY): Payer: Self-pay | Admitting: *Deleted

## 2011-04-15 LAB — CBC
HCT: 32.2 % — ABNORMAL LOW (ref 36.0–46.0)
MCHC: 34.5 g/dL (ref 30.0–36.0)
MCV: 89.9 fL (ref 78.0–100.0)
RDW: 12.6 % (ref 11.5–15.5)

## 2011-04-15 LAB — RPR: RPR Ser Ql: NONREACTIVE

## 2011-04-15 MED ORDER — PRENATAL PLUS 27-1 MG PO TABS
1.0000 | ORAL_TABLET | Freq: Every day | ORAL | Status: DC
  Administered 2011-04-15 – 2011-04-16 (×2): 1 via ORAL
  Filled 2011-04-15 (×2): qty 1

## 2011-04-15 MED ORDER — ONDANSETRON HCL 4 MG/2ML IJ SOLN: 4.0000 mg | INTRAMUSCULAR | Status: DC | PRN

## 2011-04-15 MED ORDER — LANOLIN HYDROUS EX OINT: TOPICAL_OINTMENT | CUTANEOUS | Status: DC | PRN

## 2011-04-15 MED ORDER — ZOLPIDEM TARTRATE 5 MG PO TABS: 5.0000 mg | ORAL_TABLET | Freq: Every evening | ORAL | Status: DC | PRN

## 2011-04-15 MED ORDER — BENZOCAINE-MENTHOL 20-0.5 % EX AERO
1.0000 "application " | INHALATION_SPRAY | CUTANEOUS | Status: DC | PRN
  Administered 2011-04-15: 1 via TOPICAL

## 2011-04-15 MED ORDER — LACTATED RINGERS IV SOLN: INTRAVENOUS | Status: DC

## 2011-04-15 MED ORDER — IBUPROFEN 600 MG PO TABS
600.0000 mg | ORAL_TABLET | Freq: Four times a day (QID) | ORAL | Status: DC
  Administered 2011-04-15 – 2011-04-16 (×6): 600 mg via ORAL
  Filled 2011-04-15 (×6): qty 1

## 2011-04-15 MED ORDER — SENNOSIDES-DOCUSATE SODIUM 8.6-50 MG PO TABS
2.0000 | ORAL_TABLET | Freq: Every day | ORAL | Status: DC
Start: 2011-04-15 — End: 2011-04-16
  Administered 2011-04-15: 2 via ORAL

## 2011-04-15 MED ORDER — TETANUS-DIPHTH-ACELL PERTUSSIS 5-2.5-18.5 LF-MCG/0.5 IM SUSP
0.5000 mL | Freq: Once | INTRAMUSCULAR | Status: AC
  Administered 2011-04-16: 0.5 mL via INTRAMUSCULAR
  Filled 2011-04-15: qty 0.5

## 2011-04-15 MED ORDER — OXYCODONE-ACETAMINOPHEN 5-325 MG PO TABS: 1.0000 | ORAL_TABLET | ORAL | Status: DC | PRN

## 2011-04-15 MED ORDER — ONDANSETRON HCL 4 MG PO TABS: 4.0000 mg | ORAL_TABLET | ORAL | Status: DC | PRN

## 2011-04-15 MED ORDER — DIPHENHYDRAMINE HCL 25 MG PO CAPS: 25.0000 mg | ORAL_CAPSULE | Freq: Four times a day (QID) | ORAL | Status: DC | PRN

## 2011-04-15 MED ORDER — WITCH HAZEL-GLYCERIN EX PADS: 1.0000 "application " | MEDICATED_PAD | CUTANEOUS | Status: DC | PRN

## 2011-04-15 MED ORDER — OXYTOCIN 20 UNITS IN LACTATED RINGERS INFUSION - SIMPLE: 125.0000 mL/h | INTRAVENOUS | Status: DC | PRN

## 2011-04-15 MED ORDER — DIBUCAINE 1 % RE OINT: 1.0000 "application " | TOPICAL_OINTMENT | RECTAL | Status: DC | PRN

## 2011-04-15 MED ORDER — BENZOCAINE-MENTHOL 20-0.5 % EX AERO
INHALATION_SPRAY | CUTANEOUS | Status: AC
  Administered 2011-04-15: 1 via TOPICAL
  Filled 2011-04-15: qty 56

## 2011-04-15 MED ORDER — SIMETHICONE 80 MG PO CHEW: 80.0000 mg | CHEWABLE_TABLET | ORAL | Status: DC | PRN

## 2011-04-15 NOTE — Op Note (Signed)
Delivery Note At 2:32 AM a viable female was delivered via Vaginal, Spontaneous Delivery (Presentation: Middle Occiput Anterior).  APGAR: 8, 9; weight 8 lb 3.2 oz (3720 g).   Placenta status: Intact, Spontaneous.  Cord: 3 vessels with the following complications: None.  Cord pH: n/a  Anesthesia: Epidural  Episiotomy: None Lacerations: 2nd degree;Perineal Suture Repair: 3.0 vicryl Est. Blood Loss (mL): 450  Mom to postpartum.  Baby to nursery-stable.  HAYGOOD,VANESSA P 04/15/2011, 3:01 AM

## 2011-04-15 NOTE — Anesthesia Postprocedure Evaluation (Signed)
  Anesthesia Post-op Note  Patient: Alexis Hoover  Procedure(s) Performed: * No procedures listed *  Patient Location: PACU and Mother/Baby  Anesthesia Type: Epidural  Level of Consciousness: awake, alert  and oriented  Airway and Oxygen Therapy: Patient Spontanous Breathing  Post-op Pain: mild  Post-op Assessment: Patient's Cardiovascular Status Stable  Post-op Vital Signs: stable  Complications: No apparent anesthesia complications

## 2011-04-16 NOTE — Discharge Summary (Signed)
   Obstetric Discharge Summary Reason for Admission: onset of labor Prenatal Procedures: ultrasound Intrapartum Procedures: spontaneous vaginal delivery Postpartum Procedures: none Complications-Operative and Postpartum: 2nd degree perineal laceration  Temp:  [97.5 F (36.4 C)-98 F (36.7 C)] 97.6 F (36.4 C) (08/18 0515) Pulse Rate:  [77-101] 94  (08/18 0515) Resp:  [18-20] 18  (08/18 0515) BP: (100-124)/(61-77) 114/77 mmHg (08/18 0515) SpO2:  [98 %] 98 % (08/17 2000) Hemoglobin  Date Value Range Status  04/15/2011 11.1* 12.0-15.0 (g/dL) Final     HCT  Date Value Range Status  04/15/2011 32.2* 36.0-46.0 (%) Final    Hospital Course:  Presented to [facility] in active labor, admitted and had Epidural for Pain management during labor. Progressed to Complete and del a LFI "Gracie". 2nd degree laceration per Dr. Pennie Rushing. PP course unremarkable with Breastfeeding established. Pt desired d/c home on PP day 1.   Discharge Diagnoses: Term Pregnancy-delivered  Discharge Information: Date: 04/16/2011 Activity: pelvic rest Diet: routine Medications: Motrin 600 mg po q 6 hours prn pain Micronor to start in 3 weeks or after for contraception Medication List  As of 04/16/2011 10:17 AM   CONTINUE taking these medications         fish oil-omega-3 fatty acids 1000 MG capsule      Prenatal Plus 65-1 MG Tabs           Condition: stable Instructions: refer to practice specific booklet Discharge to: home Follow-up Information    Follow up with [facility]. Make an appointment in 6 weeks. (or  as needed if symptoms worsen)          Newborn Data: Live born  Information for the patient's newborn:  Schunk, Girl Collins [[id number]]  female ; APGAR 8/9 , ; weight 8lbs3oz  "Gracie" Home with mother.  Anabel Halon 04/16/2011, 10:17 AM

## 2011-04-16 NOTE — Progress Notes (Signed)
Post Partum Day 1 Subjective: no complaints, up ad lib, voiding, tolerating PO and Breastfeeding established, with mild nipple soreness and questions about appropriate latch  Objective: Blood pressure 114/77, pulse 94, temperature 97.6 F (36.4 C), temperature source Oral, resp. rate 18, height 6' (1.829 m), weight 109.77 kg (242 lb), last menstrual period 07/09/2010, SpO2 98.00%, unknown if currently breastfeeding.  Physical Exam:  General: alert, cooperative and no distress Lochia: appropriate Uterine Fundus: firm Incision: healing well DVT Evaluation: No evidence of DVT seen on physical exam.   Basename 04/15/11 0628 04/14/11 2015  HGB 11.1* 13.1  HCT 32.2* 37.6    Assessment/Plan: Breastfeeding, Lactation consult and Contraception POP, Offered early d/c after Lactation consultation. Pt and s.o. Will consider later in day.   LOS: 2 days   Anabel Halon 04/16/2011, 7:55 AM

## 2011-04-18 ENCOUNTER — Inpatient Hospital Stay (HOSPITAL_COMMUNITY): Admission: RE | Admit: 2011-04-18 | Payer: BC Managed Care – PPO | Source: Ambulatory Visit

## 2011-05-27 ENCOUNTER — Other Ambulatory Visit: Payer: Self-pay

## 2011-05-27 MED ORDER — AMPHETAMINE-DEXTROAMPHET ER 20 MG PO CP24: 20.0000 mg | ORAL_CAPSULE | ORAL | Status: DC

## 2011-05-27 MED ORDER — ALPRAZOLAM 0.5 MG PO TABS: 0.5000 mg | ORAL_TABLET | Freq: Two times a day (BID) | ORAL | Status: DC | PRN

## 2011-05-27 NOTE — Telephone Encounter (Signed)
Done hardcopy to robin  

## 2011-05-27 NOTE — Telephone Encounter (Signed)
Pt called requesting refill, last OV 07/16/2010. Per EPIC, pt gave birth in the month of Aug.

## 2011-05-27 NOTE — Telephone Encounter (Signed)
Called the patient informed prescription is ready for pickup at front desk. 

## 2011-08-08 NOTE — Telephone Encounter (Signed)
Preadmission screen  

## 2011-08-15 ENCOUNTER — Other Ambulatory Visit: Payer: Self-pay

## 2011-08-15 MED ORDER — AMPHETAMINE-DEXTROAMPHET ER 20 MG PO CP24: 20.0000 mg | ORAL_CAPSULE | ORAL | Status: DC

## 2011-08-15 NOTE — Telephone Encounter (Signed)
Pt called requesting 3 mth refill of Adderall.

## 2011-08-15 NOTE — Telephone Encounter (Signed)
Done hardcopy to robin  

## 2011-08-16 NOTE — Telephone Encounter (Signed)
Patient informed prescription is ready for pickup and to schedule followup appointment to get more refills.

## 2011-09-27 ENCOUNTER — Telehealth: Payer: Self-pay

## 2011-09-27 NOTE — Telephone Encounter (Signed)
Called [phone] to initiate approval of Dextrophetamine amphetamine 20 mg. They approved from Jan. 8, 2013 through Jan. 29, 2014. [id number]. Called the patient informed of approval and called her pharmacy as well to inform. Once fax received will send to scan.

## 2012-01-18 ENCOUNTER — Telehealth: Payer: Self-pay | Admitting: Obstetrics and Gynecology

## 2012-01-18 NOTE — Telephone Encounter (Signed)
Left msg on pt's voice mail to call back rgd msg.  

## 2012-01-18 NOTE — Telephone Encounter (Signed)
Spoke with ep rgd msg from pt. About birth control. Pt stated that she had stopped breast feeding several days ago was on the mini -pill. Pt stated that in the past she had used lessina as her birth control.called in rx to [facility] [phone] for lessina disp 1 pack sig 1 po qd with 11 refills . Pt aware of rx. bt cma

## 2012-02-06 ENCOUNTER — Telehealth: Payer: Self-pay | Admitting: Internal Medicine

## 2012-02-06 NOTE — Telephone Encounter (Signed)
The pt called and is hoping to get her meds refilled until her august cpe apt. Thanks!

## 2012-02-07 NOTE — Telephone Encounter (Signed)
Called left message to call back 

## 2012-02-08 NOTE — Telephone Encounter (Signed)
Called the patient left detailed message to contact pharmacy on what refills she needed and we would fill those prescriptions.

## 2012-04-05 ENCOUNTER — Ambulatory Visit (INDEPENDENT_AMBULATORY_CARE_PROVIDER_SITE_OTHER): Payer: BC Managed Care – PPO | Admitting: Internal Medicine

## 2012-04-05 ENCOUNTER — Encounter: Payer: Self-pay | Admitting: Internal Medicine

## 2012-04-05 ENCOUNTER — Other Ambulatory Visit (INDEPENDENT_AMBULATORY_CARE_PROVIDER_SITE_OTHER): Payer: BC Managed Care – PPO

## 2012-04-05 VITALS — BP 102/70 | HR 80 | Temp 97.7°F | Ht 72.0 in | Wt 212.5 lb

## 2012-04-05 DIAGNOSIS — F988 Other specified behavioral and emotional disorders with onset usually occurring in childhood and adolescence: Secondary | ICD-10-CM

## 2012-04-05 DIAGNOSIS — Z0001 Encounter for general adult medical examination with abnormal findings: Secondary | ICD-10-CM | POA: Insufficient documentation

## 2012-04-05 DIAGNOSIS — F411 Generalized anxiety disorder: Secondary | ICD-10-CM

## 2012-04-05 DIAGNOSIS — Z0000 Encounter for general adult medical examination without abnormal findings: Secondary | ICD-10-CM | POA: Insufficient documentation

## 2012-04-05 LAB — BASIC METABOLIC PANEL
CO2: 25 mEq/L (ref 19–32)
Calcium: 9.1 mg/dL (ref 8.4–10.5)
Chloride: 106 mEq/L (ref 96–112)
Potassium: 3.5 mEq/L (ref 3.5–5.1)
Sodium: 137 mEq/L (ref 135–145)

## 2012-04-05 LAB — HEPATIC FUNCTION PANEL
ALT: 14 U/L (ref 0–35)
AST: 19 U/L (ref 0–37)
Alkaline Phosphatase: 63 U/L (ref 39–117)
Bilirubin, Direct: 0.1 mg/dL (ref 0.0–0.3)
Total Protein: 7.3 g/dL (ref 6.0–8.3)

## 2012-04-05 LAB — URINALYSIS, ROUTINE W REFLEX MICROSCOPIC
Ketones, ur: NEGATIVE
Urine Glucose: NEGATIVE
Urobilinogen, UA: 0.2 (ref 0.0–1.0)

## 2012-04-05 LAB — CBC WITH DIFFERENTIAL/PLATELET
Basophils Absolute: 0.1 10*3/uL (ref 0.0–0.1)
Eosinophils Absolute: 0.1 10*3/uL (ref 0.0–0.7)
Hemoglobin: 12.8 g/dL (ref 12.0–15.0)
Lymphocytes Relative: 43.1 % (ref 12.0–46.0)
MCHC: 33.8 g/dL (ref 30.0–36.0)
Neutro Abs: 3.2 10*3/uL (ref 1.4–7.7)
RDW: 12.5 % (ref 11.5–14.6)

## 2012-04-05 LAB — LIPID PANEL: Total CHOL/HDL Ratio: 3

## 2012-04-05 MED ORDER — AMPHETAMINE-DEXTROAMPHET ER 20 MG PO CP24: 20.0000 mg | ORAL_CAPSULE | ORAL | Status: DC

## 2012-04-05 MED ORDER — ALPRAZOLAM 0.5 MG PO TABS: 0.5000 mg | ORAL_TABLET | Freq: Two times a day (BID) | ORAL | Status: AC | PRN

## 2012-04-05 NOTE — Assessment & Plan Note (Signed)
stable overall by hx and exam, and pt to continue medical treatment as before 

## 2012-04-05 NOTE — Patient Instructions (Addendum)
OK to Re-start your medications as prescribed Please go to LAB in the Basement for the blood and/or urine tests to be done today You will be contacted by phone if any changes need to be made immediately.  Otherwise, you will receive a letter about your results with an explanation. Please have the pharmacy call with any refills you may need. Please return in 1 year for your yearly visit, or sooner if needed, with Lab testing done 3-5 days before

## 2012-04-05 NOTE — Assessment & Plan Note (Signed)
stable overall by hx and exam, , and pt to continue medical treatment as before   

## 2012-04-05 NOTE — Assessment & Plan Note (Signed)

## 2012-04-05 NOTE — Progress Notes (Signed)
Subjective:    Patient ID: Alexis Hoover, female    DOB: [birth year], 31 y.o.   MRN: [id number]  HPI  Here for wellness and f/u;  Overall doing ok;  Pt denies CP, worsening SOB, DOE, wheezing, orthopnea, PND, worsening LE edema, palpitations, dizziness or syncope.  Pt denies neurological change such as new Headache, facial or extremity weakness.  Pt denies polydipsia, polyuria, or low sugar symptoms. Pt states overall good compliance with treatment and medications, good tolerability, and trying to follow lower cholesterol diet.  Pt denies worsening depressive symptoms, suicidal ideation or panic. No fever, wt loss, night sweats, loss of appetite, or other constitutional symptoms.  Pt states good ability with ADL's, low fall risk, home safety reviewed and adequate, no significant changes in hearing or vision, and occasionally active with exercise.  ADD and anxiety meds working well, now back to taking after her successful pregnancy last yr.  No other acute complaints Past Medical History  Diagnosis Date  . No pertinent past medical history    Past Surgical History  Procedure Date  . No past surgeries     reports that she has never smoked. She does not have any smokeless tobacco history on file. She reports that she does not drink alcohol or use illicit drugs. family history includes Asthma in her sister; Cancer in her paternal grandfather; Diabetes in her maternal grandmother; Hypertension in her mother; Stroke in her maternal grandmother; and Vision loss in her maternal grandmother. No Known Allergies Current Outpatient Prescriptions on File Prior to Visit  Medication Sig Dispense Refill  . DISCONTD: amphetamine-dextroamphetamine (ADDERALL XR) 20 MG 24 hr capsule Take 1 capsule (20 mg total) by mouth every morning. Please make return office visit for further refills  30 capsule  0   Review of Systems Review of Systems  Constitutional: Negative for diaphoresis, activity change, appetite  change and unexpected weight change.  HENT: Negative for hearing loss, ear pain, facial swelling, mouth sores and neck stiffness.   Eyes: Negative for pain, redness and visual disturbance.  Respiratory: Negative for shortness of breath and wheezing.   Cardiovascular: Negative for chest pain and palpitations.  Gastrointestinal: Negative for diarrhea, blood in stool, abdominal distention and rectal pain.  Genitourinary: Negative for hematuria, flank pain and decreased urine volume.  Musculoskeletal: Negative for myalgias and joint swelling.  Skin: Negative for color change and wound.  Neurological: Negative for syncope and numbness.  Hematological: Negative for adenopathy.  Psychiatric/Behavioral: Negative for hallucinations, self-injury, decreased concentration and agitation.      Objective:   Physical Exam BP 102/70  Pulse 80  Temp 97.7 F (36.5 C) (Oral)  Ht 6' (1.829 m)  Wt 212 lb 8 oz (96.389 kg)  BMI 28.82 kg/m2  SpO2 96% Physical Exam  VS noted Constitutional: Pt is oriented to person, place, and time. Appears well-developed and well-nourished.  HENT:  Head: Normocephalic and atraumatic.  Right Ear: External ear normal.  Left Ear: External ear normal.  Nose: Nose normal.  Mouth/Throat: Oropharynx is clear and moist.  Eyes: Conjunctivae and EOM are normal. Pupils are equal, round, and reactive to light.  Neck: Normal range of motion. Neck supple. No JVD present. No tracheal deviation present.  Cardiovascular: Normal rate, regular rhythm, normal heart sounds and intact distal pulses.   Pulmonary/Chest: Effort normal and breath sounds normal.  Abdominal: Soft. Bowel sounds are normal. There is no tenderness.  Musculoskeletal: Normal range of motion. Exhibits no edema.  Lymphadenopathy:  Has no cervical adenopathy.  Neurological: Pt is alert and oriented to person, place, and time. Pt has normal reflexes. No cranial nerve deficit.  Skin: Skin is warm and dry. No rash noted.    Psychiatric:  Has  normal mood and affect. Behavior is normal.     Assessment & Plan:

## 2012-07-02 ENCOUNTER — Other Ambulatory Visit: Payer: Self-pay | Admitting: *Deleted

## 2012-07-02 MED ORDER — AMPHETAMINE-DEXTROAMPHET ER 20 MG PO CP24: 20.0000 mg | ORAL_CAPSULE | ORAL | Status: DC

## 2012-07-02 NOTE — Telephone Encounter (Signed)
Called the patient left detailed message that hardcopy of prescriptions are ready for pickup at the front desk.

## 2012-07-02 NOTE — Telephone Encounter (Signed)
Pt requesting 3 month refill of Adderall -last written 04/05/2012-please advise.

## 2012-07-02 NOTE — Telephone Encounter (Signed)
Done hardcopy to robin  

## 2012-08-31 ENCOUNTER — Telehealth: Payer: Self-pay | Admitting: Internal Medicine

## 2012-08-31 NOTE — Telephone Encounter (Signed)
To soon, already has rx for Aug 30, 2012

## 2012-08-31 NOTE — Telephone Encounter (Signed)
Pt req refill for Adderall 20 mg. Pt req for 3 month supply, please call pt if this is ok. Last refill was 06/2012.

## 2012-08-31 NOTE — Telephone Encounter (Signed)
Called left message informed of MD instructions.

## 2012-09-26 ENCOUNTER — Telehealth: Payer: Self-pay | Admitting: *Deleted

## 2012-09-26 MED ORDER — AMPHETAMINE-DEXTROAMPHET ER 20 MG PO CP24: 20.0000 mg | ORAL_CAPSULE | ORAL | Status: DC

## 2012-09-26 NOTE — Telephone Encounter (Signed)
Called the patient left a detailed message that hardcopy's are ready for pickup at the front desk as well as Adderall PA was approved until Jan. 29, 2015.

## 2012-09-26 NOTE — Telephone Encounter (Signed)
Done hardcopy to robin  

## 2012-09-26 NOTE — Telephone Encounter (Signed)
Pt wants to know if paperwork was received on PA for Adderall and completed. . She also is requesting 3 month refill of Adderall.

## 2012-09-26 NOTE — Telephone Encounter (Signed)
Called [facility] and did get PA approval for Adderall through September 26, 2013.  Called the patient to inform left message to call back.  Also the patient would like a 3 month refill on adderall

## 2012-10-17 ENCOUNTER — Ambulatory Visit: Payer: BC Managed Care – PPO | Admitting: Obstetrics and Gynecology

## 2012-10-17 ENCOUNTER — Encounter: Payer: Self-pay | Admitting: Obstetrics and Gynecology

## 2012-10-17 VITALS — BP 102/70 | Temp 97.9°F | Ht 72.0 in | Wt 199.0 lb

## 2012-10-17 DIAGNOSIS — Z01419 Encounter for gynecological examination (general) (routine) without abnormal findings: Secondary | ICD-10-CM

## 2012-10-17 DIAGNOSIS — Z124 Encounter for screening for malignant neoplasm of cervix: Secondary | ICD-10-CM

## 2012-10-17 DIAGNOSIS — Z113 Encounter for screening for infections with a predominantly sexual mode of transmission: Secondary | ICD-10-CM

## 2012-10-17 MED ORDER — ETONOGESTREL-ETHINYL ESTRADIOL 0.12-0.015 MG/24HR VA RING: VAGINAL_RING | VAGINAL | Status: DC

## 2012-10-17 NOTE — Progress Notes (Signed)
Regular Periods: no Mammogram: no  Monthly Breast Ex.: yes Exercise: yes  Tetanus < 10 years: yes Seatbelts: yes  NI. Bladder Functn.: yes Abuse at home: no  Daily BM's: yes Stressful Work: no  Healthy Diet: yes Sigmoid-Colonoscopy: no  Calcium: no Medical problems this year: discuss cycle   LAST PAP:1/12  Contraception: Valentino Hue  Mammogram:  no  PCP: Dr. [name]  PMH: no change  FMH: no change  Last Bone Scan: no  Pt is married.

## 2012-10-17 NOTE — Progress Notes (Signed)
Subjective:    Fancy Dunkley is a 32 y.o. female, [phone], who presents for an annual exam. The patient is on oral contraceptives since she stopped breast feeding and has been on Lessina x 3 month with regular menses until this month she bled before it was time for her period.  Started the second week of pills and denies missing/late pills, vomiting/diarrhea or interfering medications  Menstrual cycle:   LMP: Patient's last menstrual period was 10/04/2012.             Review of Systems Pertinent items are noted in HPI. Denies pelvic pain, urinary tract symptoms, vaginitis symptoms, irregular bleeding, menopausal symptoms, change in bowel habits or rectal bleeding   Objective:    BP 102/70  Temp(Src) 97.9 F (36.6 C) (Oral)  Ht 6' (1.829 m)  Wt 199 lb (90.266 kg)  BMI 26.98 kg/m2  LMP 10/04/2012    Wt Readings from Last 1 Encounters:  10/17/12 199 lb (90.266 kg)   Body mass index is 26.98 kg/(m^2). General Appearance: Alert, no acute distress HEENT: Grossly normal Neck / Thyroid: Supple, no thyromegaly or cervical adenopathy Lungs: Clear to auscultation bilaterally Back: No CVA tenderness Breast Exam: No masses or nodes.No dimpling, nipple retraction or discharge. Cardiovascular: Regular rate and rhythm.  Gastrointestinal: Soft, non-tender, no masses or organomegaly Pelvic Exam: EGBUS-wnl, vagina-normal rugae, cervix- without lesions or tenderness, uterus appears normal size shape and consistency, adnexae-no masses or tenderness Rectovaginal: no masses and normal sphincter tone Lymphatic Exam: Non-palpable nodes in neck, clavicular,  axillary, or inguinal regions  Skin: no rashes or abnormalities Extremities: no clubbing cyanosis or edema  Neurologic: grossly normal Psychiatric: Alert and oriented  UPT-negative    Assessment:   Routine GYN Exam Contraceptive Change   Plan:  STD testing  PAP sent  Begin with next cycle Nuva Ring #1 1 pv for 21 of 28 days 11  refills   RTO 1 year or prn  Vendela Troung,ELMIRAPA-C

## 2012-10-18 LAB — PAP IG, CT-NG, RFX HPV ASCU: Chlamydia Probe Amp: NEGATIVE

## 2012-10-18 LAB — HSV 2 ANTIBODY, IGG: HSV 2 Glycoprotein G Ab, IgG: 0.16 IV

## 2013-03-06 ENCOUNTER — Telehealth: Payer: Self-pay | Admitting: *Deleted

## 2013-03-06 MED ORDER — AMPHETAMINE-DEXTROAMPHET ER 20 MG PO CP24: 20.0000 mg | ORAL_CAPSULE | ORAL | Status: DC

## 2013-03-06 NOTE — Telephone Encounter (Signed)
Pt called requesting refill on Adderall, pts last OV was 8.8.2013.  Please advise.

## 2013-03-06 NOTE — Telephone Encounter (Signed)
Done hardcopy to robin  

## 2013-03-06 NOTE — Telephone Encounter (Signed)
Called the patient left detailed message that hardcopy is ready for pickup at the front desk. 

## 2013-05-01 ENCOUNTER — Telehealth: Payer: Self-pay | Admitting: *Deleted

## 2013-05-01 MED ORDER — AMPHETAMINE-DEXTROAMPHET ER 20 MG PO CP24: 20.0000 mg | ORAL_CAPSULE | ORAL | Status: DC

## 2013-05-01 NOTE — Telephone Encounter (Signed)
Called the patient informed to pickup hardcopy at the front desk. 

## 2013-05-01 NOTE — Telephone Encounter (Signed)
Done hardcopy to robin  

## 2013-05-01 NOTE — Telephone Encounter (Signed)
Pt called requesting Adderall Rx.  Pt last OV 8.8.2013.  Please advise

## 2013-06-17 ENCOUNTER — Telehealth: Payer: Self-pay | Admitting: Internal Medicine

## 2013-06-17 NOTE — Telephone Encounter (Signed)
Patient requesting 3 month supply of Adderall refills, call when ready for pick up

## 2013-06-18 MED ORDER — AMPHETAMINE-DEXTROAMPHET ER 20 MG PO CP24: 20.0000 mg | ORAL_CAPSULE | ORAL | Status: DC

## 2013-06-18 NOTE — Telephone Encounter (Signed)
Unfort, can only do 1 refill as she is due for ROV

## 2013-06-19 NOTE — Telephone Encounter (Signed)
Called the patient left a detailed message of MD instructions and to pickup hardcopy at the front desk. 

## 2013-07-24 ENCOUNTER — Encounter: Payer: Self-pay | Admitting: Internal Medicine

## 2013-07-24 ENCOUNTER — Ambulatory Visit (INDEPENDENT_AMBULATORY_CARE_PROVIDER_SITE_OTHER): Payer: BC Managed Care – PPO | Admitting: Internal Medicine

## 2013-07-24 VITALS — BP 110/72 | HR 90 | Temp 98.2°F | Ht 72.0 in | Wt 186.5 lb

## 2013-07-24 DIAGNOSIS — F988 Other specified behavioral and emotional disorders with onset usually occurring in childhood and adolescence: Secondary | ICD-10-CM

## 2013-07-24 DIAGNOSIS — Z0000 Encounter for general adult medical examination without abnormal findings: Secondary | ICD-10-CM

## 2013-07-24 MED ORDER — AMPHETAMINE-DEXTROAMPHET ER 20 MG PO CP24: 20.0000 mg | ORAL_CAPSULE | ORAL | Status: DC

## 2013-07-24 NOTE — Assessment & Plan Note (Signed)

## 2013-07-24 NOTE — Assessment & Plan Note (Signed)
stable overall by history and exam, and pt to continue medical treatment as before,  to f/u any worsening symptoms or concerns, for refills today

## 2013-07-24 NOTE — Progress Notes (Signed)
Subjective:    Patient ID: Alexis Hoover, female    DOB: 11/27/1980, 32 y.o.   MRN: [id number]  HPI  Here for wellness and f/u;  Overall doing ok;  Pt denies CP, worsening SOB, DOE, wheezing, orthopnea, PND, worsening LE edema, palpitations, dizziness or syncope.  Pt denies neurological change such as new headache, facial or extremity weakness.  Pt denies polydipsia, polyuria, or low sugar symptoms. Pt states overall good compliance with treatment and medications, good tolerability, and has been trying to follow lower cholesterol diet.  Pt denies worsening depressive symptoms, suicidal ideation or panic. No fever, night sweats, wt loss, loss of appetite, or other constitutional symptoms.  Pt states good ability with ADL's, has low fall risk, home safety reviewed and adequate, no other significant changes in hearing or vision, and occasionally active with exercise., works as Tree surgeon. Past Medical History  Diagnosis Date  . No pertinent past medical history   . Abnormal Pap smear     LEEP   Past Surgical History  Procedure Laterality Date  . Leep      reports that she has never smoked. She has never used smokeless tobacco. She reports that she drinks alcohol. She reports that she does not use illicit drugs. family history includes Asthma in her sister; Cancer in her paternal grandfather; Diabetes in her maternal grandmother; Hypertension in her mother; Stroke in her maternal grandmother; Vision loss in her maternal grandmother. No Known Allergies Current Outpatient Prescriptions on File Prior to Visit  Medication Sig Dispense Refill  . levonorgestrel-ethinyl estradiol (AVIANE,ALESSE,LESSINA) 0.1-20 MG-MCG tablet Take 1 tablet by mouth daily.      Marland Kitchen amphetamine-dextroamphetamine (ADDERALL XR) 20 MG 24 hr capsule Take 1 capsule (20 mg total) by mouth every morning.  30 capsule  0   No current facility-administered medications on file prior to visit.   Review of  Systems Constitutional: Negative for diaphoresis, activity change, appetite change or unexpected weight change.  HENT: Negative for hearing loss, ear pain, facial swelling, mouth sores and neck stiffness.   Eyes: Negative for pain, redness and visual disturbance.  Respiratory: Negative for shortness of breath and wheezing.   Cardiovascular: Negative for chest pain and palpitations.  Gastrointestinal: Negative for diarrhea, blood in stool, abdominal distention or other pain Genitourinary: Negative for hematuria, flank pain or change in urine volume.  Musculoskeletal: Negative for myalgias and joint swelling.  Skin: Negative for color change and wound.  Neurological: Negative for syncope and numbness. other than noted Hematological: Negative for adenopathy.  Psychiatric/Behavioral: Negative for hallucinations, self-injury, decreased concentration and agitation.      Objective:   Physical Exam BP 110/72  Pulse 90  Temp(Src) 98.2 F (36.8 C) (Oral)  Ht 6' (1.829 m)  Wt 186 lb 8 oz (84.596 kg)  BMI 25.29 kg/m2  SpO2 98% VS noted,  Constitutional: Pt is oriented to person, place, and time. Appears well-developed and well-nourished.  Head: Normocephalic and atraumatic.  Right Ear: External ear normal.  Left Ear: External ear normal.  Nose: Nose normal.  Mouth/Throat: Oropharynx is clear and moist.  Eyes: Conjunctivae and EOM are normal. Pupils are equal, round, and reactive to light.  Neck: Normal range of motion. Neck supple. No JVD present. No tracheal deviation present.  Cardiovascular: Normal rate, regular rhythm, normal heart sounds and intact distal pulses.   Pulmonary/Chest: Effort normal and breath sounds normal.  Abdominal: Soft. Bowel sounds are normal. There is no tenderness. No HSM  Musculoskeletal: Normal range  of motion. Exhibits no edema.  Lymphadenopathy:  Has no cervical adenopathy.  Neurological: Pt is alert and oriented to person, place, and time. Pt has normal  reflexes. No cranial nerve deficit.  Skin: Skin is warm and dry. No rash noted.  Psychiatric:  Has  normal mood and affect. Behavior is normal.     Assessment & Plan:

## 2013-07-24 NOTE — Patient Instructions (Signed)
Please continue all other medications as before, and refills have been done if requested. Please have the pharmacy call with any other refills you may need. Please continue your efforts at being more active, low cholesterol diet, and weight control. You are otherwise up to date with prevention measures today. Please keep your appointments with your specialists as you have planned - GYN yearly  Please remember to sign up for My Chart if you have not done so, as this will be important to you in the future with finding out test results, communicating by private email, and scheduling acute appointments online when needed.  Please return in 1 year for your yearly visit, or sooner if needed, with Lab testing done 3-5 days before

## 2013-07-24 NOTE — Progress Notes (Signed)
Pre-visit discussion using our clinic review tool. No additional management support is needed unless otherwise documented below in the visit note.  

## 2013-11-19 ENCOUNTER — Telehealth: Payer: Self-pay | Admitting: Internal Medicine

## 2013-11-19 NOTE — Telephone Encounter (Signed)
Pt called stated that Adderall need PA to get the insurance company to pay for. Please help

## 2013-11-19 NOTE — Telephone Encounter (Signed)
PA form has been received will do asap.

## 2013-11-21 NOTE — Telephone Encounter (Signed)
Called [facility] [phone] case [id number] for Adderall.  Medication has been approved from 10/30/2013 through 11/21/2014.  Patient has been informed.  [facility] from [facility] will be faxed and will send on to pharmacy as well.

## 2014-01-06 ENCOUNTER — Telehealth: Payer: Self-pay | Admitting: *Deleted

## 2014-01-06 MED ORDER — AMPHETAMINE-DEXTROAMPHET ER 20 MG PO CP24: 20.0000 mg | ORAL_CAPSULE | ORAL | Status: DC

## 2014-01-06 NOTE — Telephone Encounter (Signed)
Done hardcopy to robin  

## 2014-01-06 NOTE — Telephone Encounter (Signed)
Pt called requesting Adderall refill.  Please advise 

## 2014-01-07 NOTE — Telephone Encounter (Signed)
Called the patient informed to pickup hardcopy at the front desk. 

## 2014-02-18 ENCOUNTER — Telehealth: Payer: Self-pay | Admitting: *Deleted

## 2014-02-18 MED ORDER — AMPHETAMINE-DEXTROAMPHET ER 20 MG PO CP24: 20.0000 mg | ORAL_CAPSULE | ORAL | Status: DC

## 2014-02-18 NOTE — Telephone Encounter (Signed)
Done hardcopy to robin  

## 2014-02-18 NOTE — Telephone Encounter (Signed)
Left msg on triage requesting refill on her Adderrall../lmb 

## 2014-02-18 NOTE — Telephone Encounter (Signed)
Notified pt rx ready for pick-up,,,/lmb 

## 2014-03-27 ENCOUNTER — Telehealth: Payer: Self-pay | Admitting: *Deleted

## 2014-03-27 NOTE — Telephone Encounter (Signed)
Pt is requesting refill on her adderral. MD is out of office pls advise...Raechel Chute/lmb

## 2014-03-27 NOTE — Telephone Encounter (Signed)
OK X1 

## 2014-03-28 MED ORDER — AMPHETAMINE-DEXTROAMPHET ER 20 MG PO CP24: 20.0000 mg | ORAL_CAPSULE | ORAL | Status: DC

## 2014-03-28 NOTE — Telephone Encounter (Signed)
Called pt no answer LMOM rx ready for pick-up.../lmb 

## 2014-05-07 ENCOUNTER — Telehealth: Payer: Self-pay | Admitting: *Deleted

## 2014-05-07 MED ORDER — AMPHETAMINE-DEXTROAMPHET ER 20 MG PO CP24: 20.0000 mg | ORAL_CAPSULE | ORAL | Status: DC

## 2014-05-07 NOTE — Telephone Encounter (Signed)
Left msg on triage requesting refill on her adderral.../lmb 

## 2014-05-07 NOTE — Telephone Encounter (Signed)
Done hardcopy to robin  

## 2014-05-08 NOTE — Telephone Encounter (Signed)
Called the patient left a detailed message that Adderall prescription was filled and she can pickup hardcopy at the front desk at her convenience.

## 2014-06-18 ENCOUNTER — Telehealth: Payer: Self-pay | Admitting: *Deleted

## 2014-06-18 MED ORDER — AMPHETAMINE-DEXTROAMPHET ER 20 MG PO CP24: 20.0000 mg | ORAL_CAPSULE | ORAL | Status: DC

## 2014-06-18 NOTE — Telephone Encounter (Signed)
Left msg on triage requesting refill on her adderal.../lmb 

## 2014-06-18 NOTE — Telephone Encounter (Signed)
Called pt no answer LMOM with md response. Place rx in cabinet...Raechel Chute/lmb

## 2014-06-18 NOTE — Telephone Encounter (Signed)
Done hardcopy to robin  Due for ROV nov 2015

## 2014-06-26 ENCOUNTER — Encounter: Payer: Self-pay | Admitting: Sports Medicine

## 2014-06-26 ENCOUNTER — Ambulatory Visit (INDEPENDENT_AMBULATORY_CARE_PROVIDER_SITE_OTHER): Payer: BC Managed Care – PPO | Admitting: Sports Medicine

## 2014-06-26 ENCOUNTER — Ambulatory Visit
Admission: RE | Admit: 2014-06-26 | Discharge: 2014-06-26 | Disposition: A | Discharge status: Home / Self Care | Payer: BC Managed Care – PPO | Source: Ambulatory Visit | Attending: Sports Medicine | Admitting: Sports Medicine

## 2014-06-26 VITALS — BP 108/55 | Ht 72.0 in | Wt 183.0 lb

## 2014-06-26 DIAGNOSIS — M545 Low back pain: Secondary | ICD-10-CM

## 2014-06-26 MED ORDER — MELOXICAM 15 MG PO TABS: ORAL_TABLET | ORAL | Status: DC

## 2014-06-26 NOTE — Progress Notes (Addendum)
   Subjective:    Patient ID: Alexis Hoover, female    DOB: 12/15/1980, 33 y.o.   MRN: [id number]  HPI chief complaint: Low back pain  33 year old female comes in today complaining of several weeks of low back pain. She began to experience sciatica 3 years ago when pregnant with her daughter. Her sciatica resolved but since then she has had intermittent low back pain. Over the past month or so it has become more constant. She is experiencing pain at rest as well as with activity. She describes it as a constant aching discomfort across her low back without radiating pain into her legs. No associated numbness or tingling. No groin pain. No fevers or chills. She was initially trying some Motrin which was helpful but that has recently become ineffective. She is also using heat and ice. She has recently taken up running. Running does not seem to exacerbate her symptoms. No change in bowel or bladder. No prior back surgeries.  Past medical history reviewed Medications reviewed No known drug allergies Socially she does not smoke, drinks alcohol on occasion, and works as a Runner, broadcasting/film/videoteacher at [employer]    Review of Systems     Objective:   Physical Exam Well-developed, well-nourished. No acute distress. Awake alert and oriented 3. Sitting can actively in the exam room. Vital signs reviewed.  Lumbar spine: No bony or soft tissue tenderness to direct palpation. No spasm. Patient has pain with forward flexion and with extension. Positive stork test bilaterally. Negative Faber's bilaterally. Negative straight leg raise bilaterally. Negative logroll bilaterally. Strength is 5/5 both lower extremities with reflexes 2/4 at the Achilles and patellar tendons bilaterally. Sensation is intact to light touch grossly. No atrophy. Leg lengths are equal. Neurovascularly intact distally. Walking without a limp.       Assessment & Plan:  Low back pain secondary to lumbar strain versus possible  stress reaction/stress fracture  X-rays of the lumbar spine. Mobic 15 mg daily for 10 days. She will start physical therapy with Ellamae SiaJohn O'Halloran. I think she can continue with activity using pain as her guide. We discussed the possibility of an MRI scan specifically to rule out a stress fracture or facet mediated low back pain which may benefit from a cortisone injection. She will check with her insurance company regarding cost of this study and let me know if she wants to proceed with this. Otherwise, follow-up for ongoing or recalcitrant issues.  Addendum: X-rays reviewed. No obvious stress fracture. No pars defect. Minimal disc space narrowing at L4-L5 and L5-S1. No significant facet changes.

## 2014-06-30 ENCOUNTER — Other Ambulatory Visit: Payer: Self-pay | Admitting: *Deleted

## 2014-06-30 ENCOUNTER — Encounter: Payer: Self-pay | Admitting: Sports Medicine

## 2014-06-30 DIAGNOSIS — M545 Low back pain: Secondary | ICD-10-CM

## 2014-07-01 ENCOUNTER — Ambulatory Visit
Admission: RE | Admit: 2014-07-01 | Discharge: 2014-07-01 | Disposition: A | Discharge status: Home / Self Care | Payer: BC Managed Care – PPO | Source: Ambulatory Visit | Attending: Sports Medicine | Admitting: Sports Medicine

## 2014-07-01 DIAGNOSIS — M545 Low back pain: Secondary | ICD-10-CM

## 2014-07-02 ENCOUNTER — Telehealth: Payer: Self-pay | Admitting: Sports Medicine

## 2014-07-02 NOTE — Telephone Encounter (Signed)
I spoke with the patient on the phone today after reviewing the MRI of her lumbar spine. 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 She also has bilateral facet hypertrophy at this level as well as at L5-S1. Based on her MRI findings and her increasing pain I recommended referral to one of the local neurosurgeons for further treatment. I'll defer further workup and treatment to their discretion and she will follow-up with me when necessary. In the meantime, I think she can hold on physical therapy as I had previously ordered.

## 2014-08-07 ENCOUNTER — Telehealth: Payer: Self-pay | Admitting: Internal Medicine

## 2014-08-07 MED ORDER — AMPHETAMINE-DEXTROAMPHET ER 20 MG PO CP24: 20.0000 mg | ORAL_CAPSULE | ORAL | Status: DC

## 2014-08-07 NOTE — Telephone Encounter (Signed)
Called pt no answer LMOM rx ready for pick-up.../lmb 

## 2014-08-07 NOTE — Telephone Encounter (Signed)
Patient has an upcoming appointment with Dr. Jonny RuizJohn.  She is requesting script for adderall extended release.

## 2014-08-07 NOTE — Telephone Encounter (Signed)
Done hardcopy to D 

## 2014-08-19 ENCOUNTER — Encounter: Payer: Self-pay | Admitting: Internal Medicine

## 2014-08-19 ENCOUNTER — Ambulatory Visit (INDEPENDENT_AMBULATORY_CARE_PROVIDER_SITE_OTHER): Payer: BC Managed Care – PPO | Admitting: Internal Medicine

## 2014-08-19 VITALS — BP 108/78 | HR 90 | Temp 98.0°F | Ht 72.0 in | Wt 186.4 lb

## 2014-08-19 DIAGNOSIS — F988 Other specified behavioral and emotional disorders with onset usually occurring in childhood and adolescence: Secondary | ICD-10-CM

## 2014-08-19 DIAGNOSIS — F909 Attention-deficit hyperactivity disorder, unspecified type: Secondary | ICD-10-CM

## 2014-08-19 DIAGNOSIS — F411 Generalized anxiety disorder: Secondary | ICD-10-CM

## 2014-08-19 MED ORDER — AMPHETAMINE-DEXTROAMPHET ER 20 MG PO CP24: 20.0000 mg | ORAL_CAPSULE | ORAL | Status: DC

## 2014-08-19 NOTE — Progress Notes (Signed)
Pre visit review using our clinic review tool, if applicable. No additional management support is needed unless otherwise documented below in the visit note. 

## 2014-08-19 NOTE — Progress Notes (Signed)
   Subjective:    Patient ID: Alexis Hoover, female    DOB: [birth year], 33 y.o.   MRN: [id number]  HPI   Here to f/u; overall doing ok,  Pt denies chest pain, increased sob or doe, wheezing, orthopnea, PND, increased LE swelling, palpitations, dizziness or syncope.  Pt denies polydipsia, polyuria, or low sugar symptoms such as weakness or confusion improved with po intake.  Pt denies new neurological symptoms such as new headache, or facial or extremity weakness or numbness.   Pt states overall good compliance with med.  Adderall working well at current dose with ability to concentrate and task completion. Past Medical History  Diagnosis Date  . No pertinent past medical history   . Abnormal Pap smear     LEEP   Past Surgical History  Procedure Laterality Date  . Leep      reports that she has never smoked. She has never used smokeless tobacco. She reports that she drinks alcohol. She reports that she does not use illicit drugs. family history includes Asthma in her sister; Cancer in her paternal grandfather; Diabetes in her maternal grandmother; Hypertension in her mother; Stroke in her maternal grandmother; Vision loss in her maternal grandmother. No Known Allergies Current Outpatient Prescriptions on File Prior to Visit  Medication Sig Dispense Refill  . amphetamine-dextroamphetamine (ADDERALL XR) 20 MG 24 hr capsule Take 1 capsule (20 mg total) by mouth every morning. 30 capsule 0  . levonorgestrel-ethinyl estradiol (AVIANE,ALESSE,LESSINA) 0.1-20 MG-MCG tablet Take 1 tablet by mouth daily.    . meloxicam (MOBIC) 15 MG tablet Take one tablet daily for 10 days then as needed. Take with food. 40 tablet 1   No current facility-administered medications on file prior to visit.   Review of Systems All otherwise neg per pt     Objective:   Physical Exam BP 108/78 mmHg  Pulse 90  Temp(Src) 98 F (36.7 C) (Oral)  Ht 6' (1.829 m)  Wt 186 lb 6 oz (84.539 kg)  BMI 25.27 kg/m2  SpO2  96% VS noted,  Constitutional: Pt appears well-developed, well-nourished.  HENT: Head: NCAT.  Right Ear: External ear normal.  Left Ear: External ear normal.  Eyes: . Pupils are equal, round, and reactive to light. Conjunctivae and EOM are normal Neck: Normal range of motion. Neck supple.  Cardiovascular: Normal rate and regular rhythm.   Pulmonary/Chest: Effort normal and breath sounds normal.  Neurological: Pt is alert. Not confused , motor grossly intact Skin: Skin is warm. No rash Psychiatric: Pt behavior is normal. No agitation. not overly nervous today, not depressed affect    Assessment & Plan:

## 2014-08-19 NOTE — Patient Instructions (Signed)
Please continue all other medications as before, and refills have been done if requested.  Please have the pharmacy call with any other refills you may need.  Please keep your appointments with your specialists as you may have planned    

## 2014-08-28 NOTE — Assessment & Plan Note (Signed)
stable overall by history and exam, and pt to continue medical treatment as before,  to f/u any worsening symptoms or concerns 

## 2014-08-28 NOTE — Assessment & Plan Note (Signed)
stable overall by history and exam, recent data reviewed with pt, and pt to continue medical treatment as before,  to f/u any worsening symptoms or concerns Lab Results  Component Value Date   WBC 6.8 04/05/2012   HGB 12.8 04/05/2012   HCT 38.0 04/05/2012   PLT 272.0 04/05/2012   GLUCOSE 85 04/05/2012   CHOL 184 04/05/2012   TRIG 72.0 04/05/2012   HDL 61.30 04/05/2012   LDLCALC 108* 04/05/2012   ALT 14 04/05/2012   AST 19 04/05/2012   NA 137 04/05/2012   K 3.5 04/05/2012   CL 106 04/05/2012   CREATININE 0.6 04/05/2012   BUN 10 04/05/2012   CO2 25 04/05/2012   TSH 1.75 04/05/2012

## 2014-10-24 ENCOUNTER — Encounter: Payer: BC Managed Care – PPO | Admitting: Internal Medicine

## 2014-10-30 ENCOUNTER — Telehealth: Payer: Self-pay | Admitting: Internal Medicine

## 2014-10-30 ENCOUNTER — Ambulatory Visit: Payer: BC Managed Care – PPO | Admitting: Internal Medicine

## 2014-10-30 NOTE — Telephone Encounter (Signed)
Called patient left vm on home phone #. Cancelled 10/30/2014 appointment per her request. No "no show" to be charged as patient has been unable to reach us.

## 2014-11-04 ENCOUNTER — Ambulatory Visit: Payer: BC Managed Care – PPO | Admitting: Internal Medicine

## 2014-11-24 ENCOUNTER — Other Ambulatory Visit: Payer: Self-pay | Admitting: *Deleted

## 2014-11-24 NOTE — Telephone Encounter (Signed)
Received fax pt needing PA on her amphetamine-Dextroamphet 20 mg. Completed PA on cover-my-meds waiting on approval status...Raechel Chute/lmb

## 2014-11-25 NOTE — Telephone Encounter (Signed)
Received fax from [facility] stating that the PA that was received was incomplete. Pls complete updated PA and fax back. Completed PA faxed back to [facility]...Raechel Chute/lmb

## 2014-11-27 NOTE — Telephone Encounter (Signed)
[facility]ed [facility] spoke with rep checking status on PA. Med has been approved starting 11/03/14-11/27/15....Raechel Chute/lmb

## 2015-02-02 ENCOUNTER — Telehealth: Payer: Self-pay | Admitting: Internal Medicine

## 2015-02-02 NOTE — Telephone Encounter (Signed)
MD out of office will hold until he return tomorrow.../lmb 

## 2015-02-02 NOTE — Telephone Encounter (Signed)
Patient is requesting script for adderall xr.  She is requesting three scripts.

## 2015-02-03 MED ORDER — AMPHETAMINE-DEXTROAMPHET ER 20 MG PO CP24: 20.0000 mg | ORAL_CAPSULE | ORAL | Status: DC

## 2015-02-03 NOTE — Telephone Encounter (Signed)
Done hardcopy to [name]  

## 2015-02-03 NOTE — Telephone Encounter (Signed)
Rx placed in cabinet for pt pick up. Pt informed

## 2015-03-10 ENCOUNTER — Other Ambulatory Visit: Payer: Self-pay | Admitting: Obstetrics and Gynecology

## 2015-03-10 DIAGNOSIS — N644 Mastodynia: Secondary | ICD-10-CM

## 2015-03-12 ENCOUNTER — Ambulatory Visit
Admission: RE | Admit: 2015-03-12 | Discharge: 2015-03-12 | Disposition: A | Discharge status: Home / Self Care | Payer: BC Managed Care – PPO | Source: Ambulatory Visit | Attending: Obstetrics and Gynecology | Admitting: Obstetrics and Gynecology

## 2015-03-12 DIAGNOSIS — N644 Mastodynia: Secondary | ICD-10-CM

## 2015-06-23 ENCOUNTER — Telehealth: Payer: Self-pay | Admitting: Internal Medicine

## 2015-06-23 MED ORDER — AMPHETAMINE-DEXTROAMPHET ER 20 MG PO CP24: 20.0000 mg | ORAL_CAPSULE | ORAL | Status: DC

## 2015-06-23 NOTE — Telephone Encounter (Signed)
Done hardcopy to [name]  

## 2015-06-23 NOTE — Telephone Encounter (Signed)
Pt called in requesting her adderall refill

## 2015-08-18 ENCOUNTER — Telehealth: Payer: Self-pay | Admitting: *Deleted

## 2015-08-18 MED ORDER — AMPHETAMINE-DEXTROAMPHET ER 20 MG PO CP24: 20.0000 mg | ORAL_CAPSULE | ORAL | Status: DC

## 2015-08-18 NOTE — Telephone Encounter (Signed)
[facility]  Pt due for ROV please for further refills

## 2015-08-18 NOTE — Telephone Encounter (Signed)
Left msg on triage requesting refill on her Adderral.../lmb 

## 2015-08-18 NOTE — Telephone Encounter (Signed)
Called pt no answer LMOM rx ready for pick-up.../lmb 

## 2015-10-29 ENCOUNTER — Telehealth: Payer: Self-pay | Admitting: *Deleted

## 2015-10-29 MED ORDER — AMPHETAMINE-DEXTROAMPHET ER 20 MG PO CP24: 20.0000 mg | ORAL_CAPSULE | ORAL | Status: DC

## 2015-10-29 NOTE — Telephone Encounter (Signed)
Ok, printed.

## 2015-10-29 NOTE — Telephone Encounter (Signed)
Left msg on triage stating she is needing refill on her Adderral. Have appt on 4/13, but will be out before appt.MD out of the office pls advise...Raechel Chute

## 2015-10-30 NOTE — Telephone Encounter (Signed)
Called pt no answer LMOm rx ready for pick-up.../lmb 

## 2015-11-03 DIAGNOSIS — R87810 Cervical high risk human papillomavirus (HPV) DNA test positive: Secondary | ICD-10-CM | POA: Insufficient documentation

## 2015-12-10 ENCOUNTER — Ambulatory Visit (INDEPENDENT_AMBULATORY_CARE_PROVIDER_SITE_OTHER): Payer: BC Managed Care – PPO | Admitting: Internal Medicine

## 2015-12-10 ENCOUNTER — Encounter: Payer: Self-pay | Admitting: Internal Medicine

## 2015-12-10 VITALS — BP 116/78 | HR 76 | Temp 97.9°F | Ht 72.0 in | Wt 196.0 lb

## 2015-12-10 DIAGNOSIS — F988 Other specified behavioral and emotional disorders with onset usually occurring in childhood and adolescence: Secondary | ICD-10-CM

## 2015-12-10 DIAGNOSIS — Z0000 Encounter for general adult medical examination without abnormal findings: Secondary | ICD-10-CM

## 2015-12-10 DIAGNOSIS — F909 Attention-deficit hyperactivity disorder, unspecified type: Secondary | ICD-10-CM | POA: Diagnosis not present

## 2015-12-10 MED ORDER — AMPHETAMINE-DEXTROAMPHET ER 20 MG PO CP24: 20.0000 mg | ORAL_CAPSULE | ORAL | Status: DC

## 2015-12-10 NOTE — Progress Notes (Signed)
Pre visit review using our clinic review tool, if applicable. No additional management support is needed unless otherwise documented below in the visit note. 

## 2015-12-10 NOTE — Assessment & Plan Note (Signed)
stable overall by history and exam, recent data reviewed with pt, and pt to continue medical treatment as before,  to f/u any worsening symptoms or concerns Lab Results  Component Value Date   WBC 6.8 04/05/2012   HGB 12.8 04/05/2012   HCT 38.0 04/05/2012   PLT 272.0 04/05/2012   GLUCOSE 85 04/05/2012   CHOL 184 04/05/2012   TRIG 72.0 04/05/2012   HDL 61.30 04/05/2012   LDLCALC 108* 04/05/2012   ALT 14 04/05/2012   AST 19 04/05/2012   NA 137 04/05/2012   K 3.5 04/05/2012   CL 106 04/05/2012   CREATININE 0.6 04/05/2012   BUN 10 04/05/2012   CO2 25 04/05/2012   TSH 1.75 04/05/2012    

## 2015-12-10 NOTE — Progress Notes (Signed)
Subjective:    Patient ID: Alexis Hoover, female    DOB: 1980-12-27, 35 y.o.   MRN: [id number]  HPI  Here for wellness and f/u;  Overall doing ok;  Pt denies Chest pain, worsening SOB, DOE, wheezing, orthopnea, PND, worsening LE edema, palpitations, dizziness or syncope.  Pt denies neurological change such as new headache, facial or extremity weakness.  Pt denies polydipsia, polyuria, or low sugar symptoms. Pt states overall good compliance with treatment and medications, good tolerability, and has been trying to follow appropriate diet.  Pt denies worsening depressive symptoms, suicidal ideation or panic. No fever, night sweats, wt loss, loss of appetite, or other constitutional symptoms.  Pt states good ability with ADL's, has low fall risk, home safety reviewed and adequate, no other significant changes in hearing or vision, and only occasionally active with exercise.  ADD med working well, remains employed full time, has active social life without significant issues Wt Readings from Last 3 Encounters:  12/10/15 196 lb (88.905 kg)  08/19/14 186 lb 6 oz (84.539 kg)  06/26/14 183 lb (83.008 kg)   Past Medical History  Diagnosis Date  . No pertinent past medical history   . Abnormal Pap smear     LEEP   Past Surgical History  Procedure Laterality Date  . Leep      reports that she has never smoked. She has never used smokeless tobacco. She reports that she drinks alcohol. She reports that she does not use illicit drugs. family history includes Asthma in her sister; Cancer in her paternal grandfather; Diabetes in her maternal grandmother; Hypertension in her mother; Stroke in her maternal grandmother; Vision loss in her maternal grandmother. No Known Allergies Current Outpatient Prescriptions on File Prior to Visit  Medication Sig Dispense Refill  . levonorgestrel-ethinyl estradiol (AVIANE,ALESSE,LESSINA) 0.1-20 MG-MCG tablet Take 1 tablet by mouth daily. Reported on 12/10/2015    .  meloxicam (MOBIC) 15 MG tablet Take one tablet daily for 10 days then as needed. Take with food. (Patient not taking: Reported on 12/10/2015) 40 tablet 1   No current facility-administered medications on file prior to visit.   Review of Systems Constitutional: Negative for increased diaphoresis, or other activity, appetite or siginficant weight change other than noted HENT: Negative for worsening hearing loss, ear pain, facial swelling, mouth sores and neck stiffness.   Eyes: Negative for other worsening pain, redness or visual disturbance.  Respiratory: Negative for choking or stridor Cardiovascular: Negative for other chest pain and palpitations.  Gastrointestinal: Negative for worsening diarrhea, blood in stool, or abdominal distention Genitourinary: Negative for hematuria, flank pain or change in urine volume.  Musculoskeletal: Negative for myalgias or other joint complaints.  Skin: Negative for other color change and wound or drainage.  Neurological: Negative for syncope and numbness. other than noted Hematological: Negative for adenopathy. or other swelling Psychiatric/Behavioral: Negative for hallucinations, SI, self-injury, decreased concentration or other worsening agitation.      Objective:   Physical Exam BP 116/78 mmHg  Pulse 76  Temp(Src) 97.9 F (36.6 C) (Oral)  Ht 6' (1.829 m)  Wt 196 lb (88.905 kg)  BMI 26.58 kg/m2  SpO2 99%  LMP 11/24/2015 VS noted,  Constitutional: Pt is oriented to person, place, and time. Appears well-developed and well-nourished, in no significant distress Head: Normocephalic and atraumatic  Eyes: Conjunctivae and EOM are normal. Pupils are equal, round, and reactive to light Right Ear: External ear normal.  Left Ear: External ear normal Nose: Nose normal.  Mouth/Throat: Oropharynx  is clear and moist  Neck: Normal range of motion. Neck supple. No JVD present. No tracheal deviation present or significant neck LA or mass Cardiovascular: Normal  rate, regular rhythm, normal heart sounds and intact distal pulses.   Pulmonary/Chest: Effort normal and breath sounds without rales or wheezing  Abdominal: Soft. Bowel sounds are normal. NT. No HSM  Musculoskeletal: Normal range of motion. Exhibits no edema Lymphadenopathy: Has no cervical adenopathy.  Neurological: Pt is alert and oriented to person, place, and time. Pt has normal reflexes. No cranial nerve deficit. Motor grossly intact Skin: Skin is warm and dry. No rash noted or new ulcers Psychiatric:  Has normal mood and affect. Behavior is normal.     Assessment & Plan:

## 2015-12-10 NOTE — Assessment & Plan Note (Signed)

## 2015-12-10 NOTE — Patient Instructions (Signed)

## 2015-12-28 ENCOUNTER — Telehealth: Payer: Self-pay

## 2015-12-28 NOTE — Telephone Encounter (Signed)
PA APPROVED via CoverMyMeds 

## 2015-12-28 NOTE — Telephone Encounter (Signed)
PA [id number]

## 2015-12-30 ENCOUNTER — Telehealth: Payer: Self-pay

## 2015-12-30 NOTE — Telephone Encounter (Signed)
Please advise have you seen a PA for this

## 2015-12-30 NOTE — Telephone Encounter (Signed)
Patient states the pharmacy needs a pre auhorization on this medication amphetamine-dextroamphetamine (ADDERALL XR) 20 MG

## 2015-12-30 NOTE — Telephone Encounter (Signed)
See phone note date 12/28/2015

## 2016-06-01 ENCOUNTER — Telehealth: Payer: Self-pay | Admitting: *Deleted

## 2016-06-01 MED ORDER — AMPHETAMINE-DEXTROAMPHET ER 20 MG PO CP24: 20.0000 mg | ORAL_CAPSULE | ORAL | 0 refills | Status: DC

## 2016-06-01 NOTE — Telephone Encounter (Signed)
Rec'd call pt requesting refill on her Adderall.../lmb 

## 2016-06-01 NOTE — Telephone Encounter (Signed)
Done hardcopy to Corinne  

## 2016-06-02 NOTE — Telephone Encounter (Signed)
Notified pt rx ready for pick-up.../lmb 

## 2016-07-25 ENCOUNTER — Telehealth: Payer: Self-pay | Admitting: *Deleted

## 2016-07-25 NOTE — Telephone Encounter (Signed)
Rec'd call pt requesting 3 months scripts on her Adderrall.MD out of office will hold until MD return tomorrow...Raechel Chute/lmb

## 2016-07-26 MED ORDER — AMPHETAMINE-DEXTROAMPHET ER 20 MG PO CP24: 20.0000 mg | ORAL_CAPSULE | ORAL | 0 refills | Status: DC

## 2016-07-26 NOTE — Telephone Encounter (Signed)
rx x 3 Done hardcopy to [facility]

## 2016-07-27 NOTE — Telephone Encounter (Signed)
Notified pt rx ready for pick-up.../lmb 

## 2016-09-24 IMAGING — MG MM DIAG BREAST TOMO BILATERAL
8 of 13 series · 8 of 37 positions shown · non-contrast
Comparison: Previous exam(s).

CLINICAL DATA: Left breast upper outer quadrant area of palpable
concern felt by the patient approximately 1 week ago. Patient
reports associated tenderness.

EXAM:
DIGITAL DIAGNOSTIC BILATERAL MAMMOGRAM WITH 3D TOMOSYNTHESIS WITH
CAD
ULTRASOUND LEFT BREAST

[L TAN]
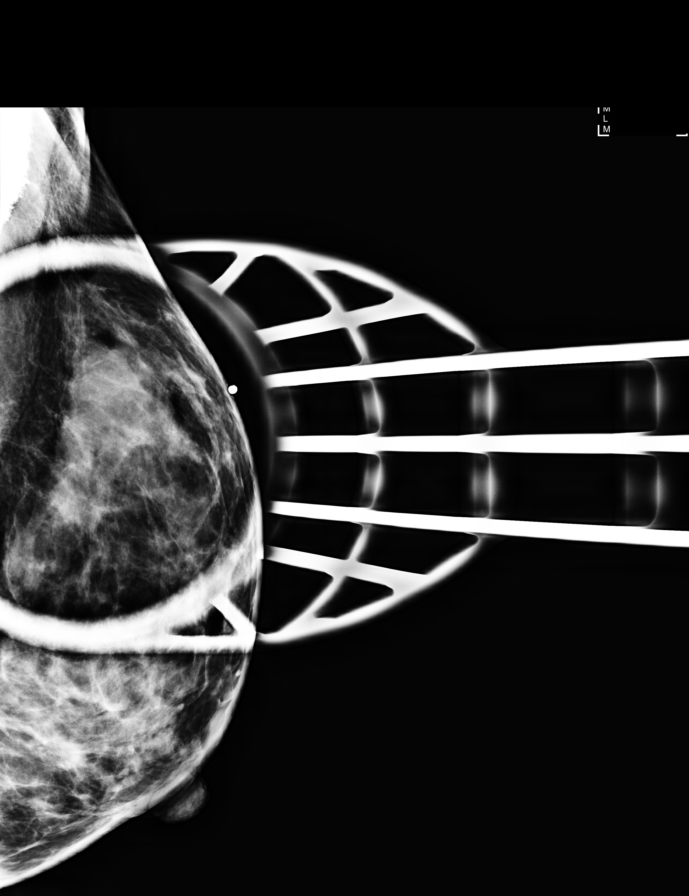

[R XCCL]
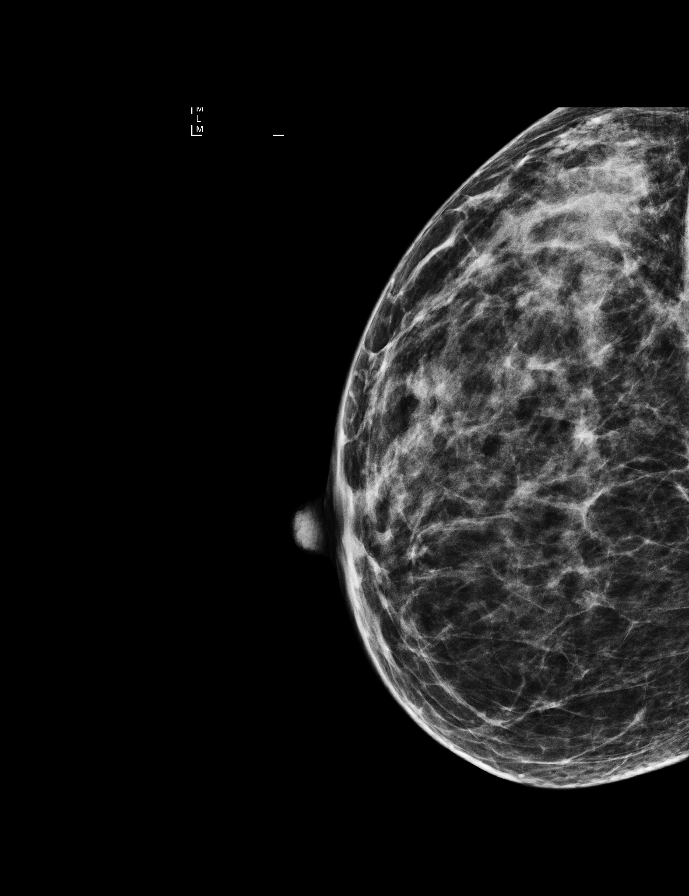

[R MLO]
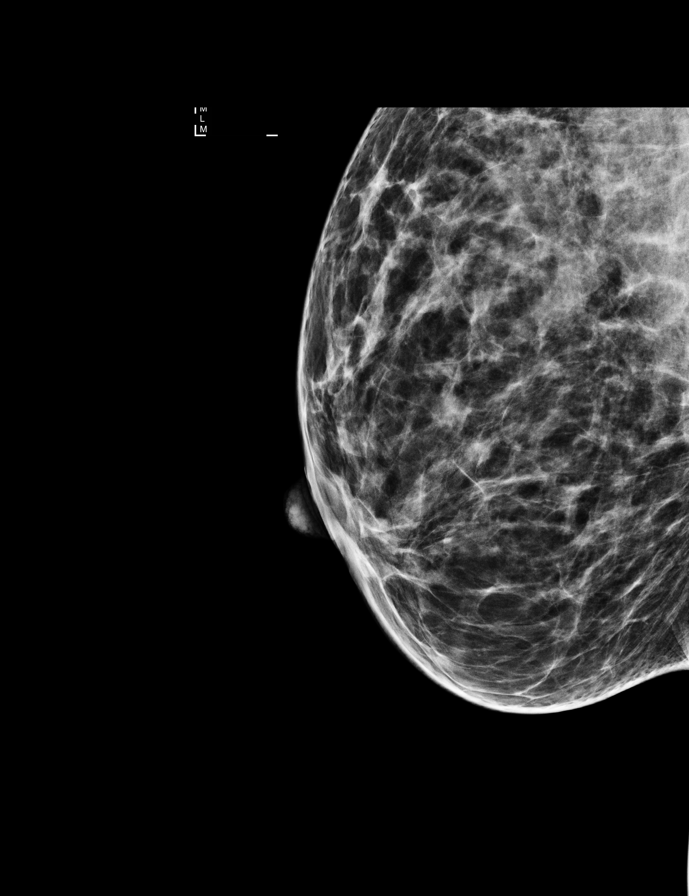

[L XCCL]
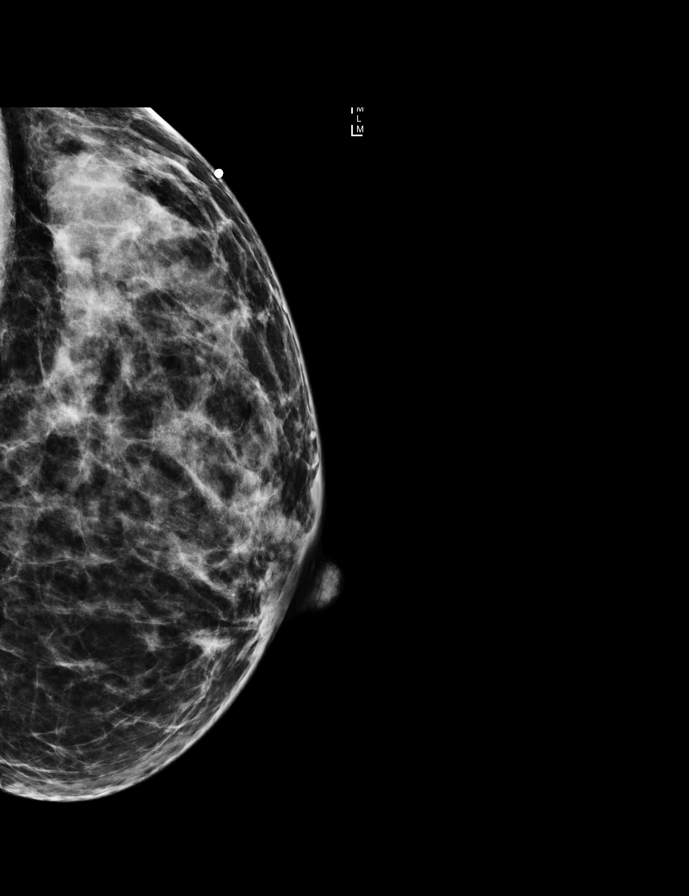

[L CC]
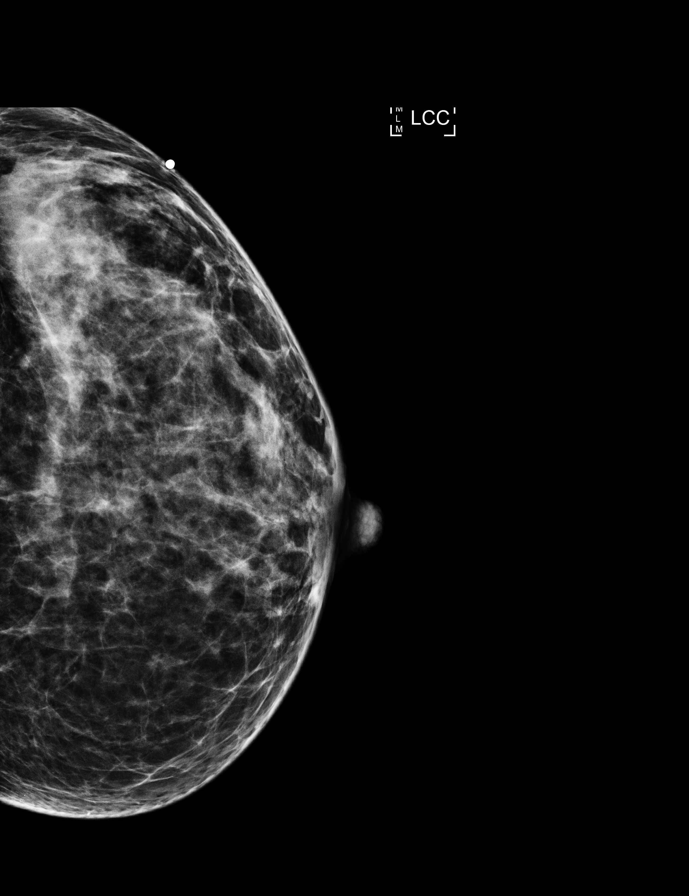

[R CC]
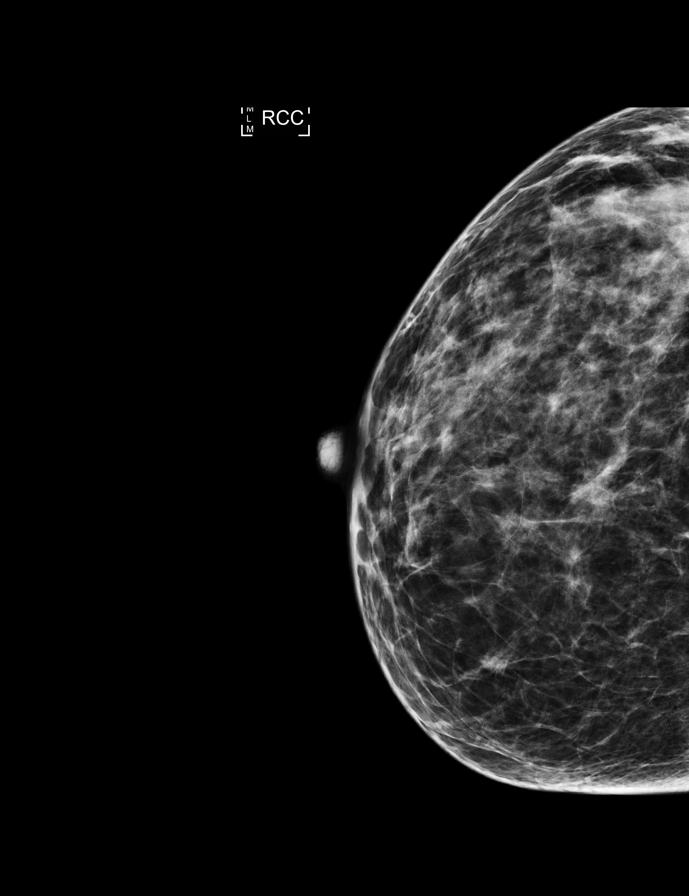

[L MLO]
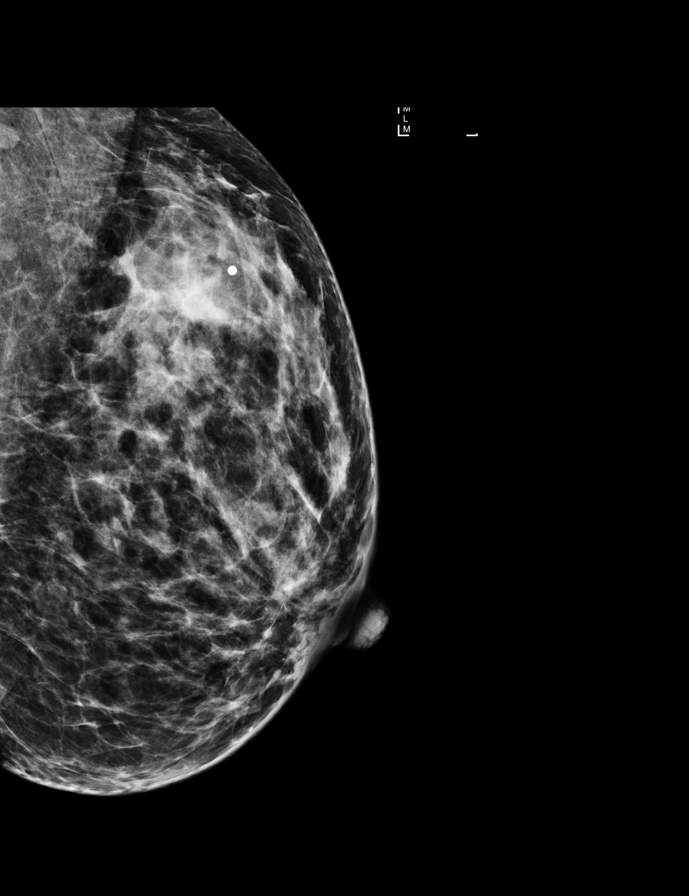

[R MLO tomo · tomo slice 25/49.0]
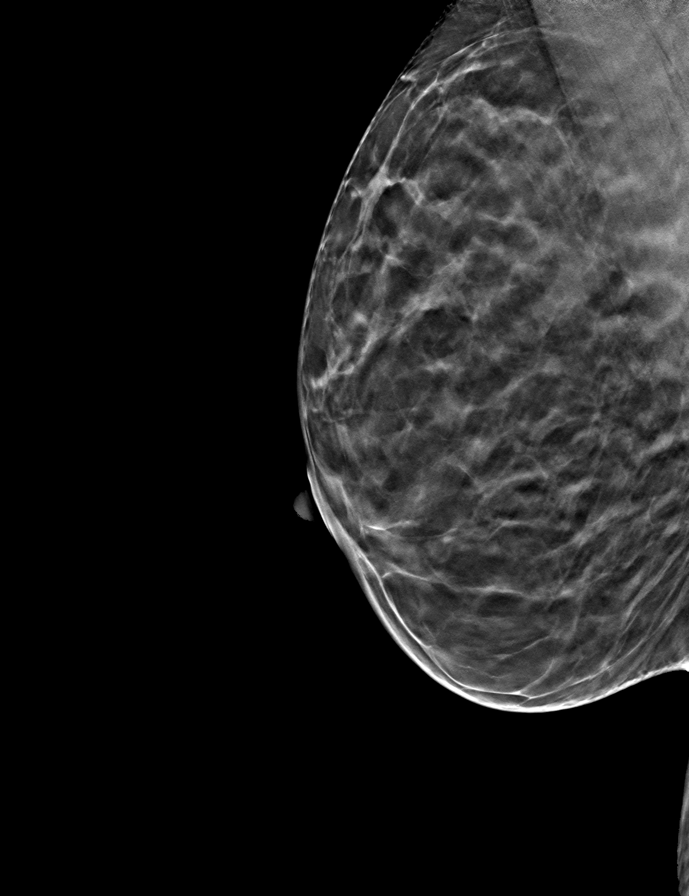

[8 of 37 positions shown; findings below may reference images not displayed]

ACR Breast Density Category c: The breast tissue is heterogeneously
dense, which may obscure small masses.
FINDINGS: There are no suspicious masses, areas of architectural distortion or
microcalcifications in the right breast.

At the site of palpable concern, left breast upper outer quadrant,
posterior depth, there is a vague non mass appearing focal
asymmetry, better seen on the MLO view. No mammographically apparent
masses are seen.

Mammographic images were processed with CAD.

On physical exam, there is a multilobulated circumscribed mobile
approximately 2 cm palpable area in the left 2 o'clock breast.

Targeted ultrasound is performed, showing no sonographic correlation
to the area of palpable concern.
IMPRESSION: Left breast upper outer quadrant palpable abnormality, which
corresponds to a vague non mass mammographic focal asymmetry,
without sonographic correlation.

RECOMMENDATION:
Given the palpable nature of the abnormality, dense breast
parenchyma, and discrepancy between mammographic and sonographic
findings, MRI of the breast is recommended for further evaluation.

I have discussed the findings and recommendations with the patient,
and Ms. Orucu Kir. Results were also provided in writing at the
conclusion of the visit. If applicable, a reminder letter will be
sent to the patient regarding the next appointment.

BI-RADS CATEGORY  0: Incomplete. Need additional imaging evaluation
and/or prior mammograms for comparison.

## 2017-01-11 ENCOUNTER — Telehealth: Payer: Self-pay | Admitting: *Deleted

## 2017-01-11 MED ORDER — AMPHETAMINE-DEXTROAMPHET ER 20 MG PO CP24: 20.0000 mg | ORAL_CAPSULE | ORAL | 0 refills | Status: DC

## 2017-01-11 NOTE — Telephone Encounter (Signed)
Rec;d call pt states she had made her annual appt but its not until 7/12, she is needing refill on her Adderrall would like to have scripts for 3 months until appt...Alexis Hoover/lmb

## 2017-01-11 NOTE — Telephone Encounter (Signed)
Done hardcopy to Shirron  

## 2017-01-12 NOTE — Telephone Encounter (Signed)
Called pt, LVM stating that scripts are ready for pick up. Front desk.

## 2017-03-09 ENCOUNTER — Encounter: Payer: BC Managed Care – PPO | Admitting: Internal Medicine

## 2017-03-17 ENCOUNTER — Ambulatory Visit: Payer: BC Managed Care – PPO | Admitting: Internal Medicine

## 2017-04-19 ENCOUNTER — Encounter: Payer: Self-pay | Admitting: Internal Medicine

## 2017-04-19 ENCOUNTER — Ambulatory Visit (INDEPENDENT_AMBULATORY_CARE_PROVIDER_SITE_OTHER): Payer: BC Managed Care – PPO | Admitting: Internal Medicine

## 2017-04-19 VITALS — BP 116/76 | HR 78 | Temp 98.0°F | Ht 72.0 in | Wt 200.0 lb

## 2017-04-19 DIAGNOSIS — Z0000 Encounter for general adult medical examination without abnormal findings: Secondary | ICD-10-CM | POA: Diagnosis not present

## 2017-04-19 DIAGNOSIS — M519 Unspecified thoracic, thoracolumbar and lumbosacral intervertebral disc disorder: Secondary | ICD-10-CM

## 2017-04-19 HISTORY — DX: Unspecified thoracic, thoracolumbar and lumbosacral intervertebral disc disorder: M51.9

## 2017-04-19 NOTE — Progress Notes (Signed)
Subjective:    Patient ID: Alexis Hoover, female    DOB: [birth year], 36 y.o.   MRN: [id number]  HPI  Here for wellness and f/u;  Overall doing ok;  Pt denies Chest pain, worsening SOB, DOE, wheezing, orthopnea, PND, worsening LE edema, palpitations, dizziness or syncope.  Pt denies neurological change such as new headache, facial or extremity weakness.  Pt denies polydipsia, polyuria, or low sugar symptoms. Pt states overall good compliance with treatment and medications, good tolerability, and has been trying to follow appropriate diet.  Pt denies worsening depressive symptoms, suicidal ideation or panic. No fever, night sweats, wt loss, loss of appetite, or other constitutional symptoms.  Pt states good ability with ADL's, has low fall risk, home safety reviewed and adequate, no other significant changes in hearing or vision, and fairly active with exercise, though has gained in last few yrs.  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  . Wt Readings from Last 3 Encounters:  04/19/17 200 lb (90.7 kg)  12/10/15 196 lb (88.9 kg)  08/19/14 186 lb 6 oz (84.5 kg)  Wants to wait for flu shot free at work in October.   Past Medical History:  Diagnosis Date  . Abnormal Pap smear    LEEP  . Lumbar disc disease 04/19/2017  . No pertinent past medical history    Past Surgical History:  Procedure Laterality Date  . LEEP      reports that she has never smoked. She has never used smokeless tobacco. She reports that she drinks alcohol. She reports that she does not use drugs. family history includes Asthma in her sister; Cancer in her paternal grandfather; Diabetes in her maternal grandmother; Hypertension in her mother; Stroke in her maternal grandmother; Vision loss in her maternal grandmother. No Known Allergies Current Outpatient Prescriptions on File Prior to Visit  Medication Sig Dispense Refill  . levonorgestrel-ethinyl estradiol (AVIANE,ALESSE,LESSINA) 0.1-20 MG-MCG tablet Take 1  tablet by mouth daily. Reported on 12/10/2015    . meloxicam (MOBIC) 15 MG tablet Take one tablet daily for 10 days then as needed. Take with food. 40 tablet 1  . MONO-LINYAH 0.25-35 MG-MCG tablet   1  . amphetamine-dextroamphetamine (ADDERALL XR) 20 MG 24 hr capsule Take 1 capsule (20 mg total) by mouth every morning. To fill March 13, 2017 30 capsule 0   No current facility-administered medications on file prior to visit.     Review of Systems Constitutional: Negative for other unusual diaphoresis, sweats, appetite or weight changes HENT: Negative for other worsening hearing loss, ear pain, facial swelling, mouth sores or neck stiffness.   Eyes: Negative for other worsening pain, redness or other visual disturbance.  Respiratory: Negative for other stridor or swelling Cardiovascular: Negative for other palpitations or other chest pain  Gastrointestinal: Negative for worsening diarrhea or loose stools, blood in stool, distention or other pain Genitourinary: Negative for hematuria, flank pain or other change in urine volume.  Musculoskeletal: Negative for myalgias or other joint swelling.  Skin: Negative for other color change, or other wound or worsening drainage.  Neurological: Negative for other syncope or numbness. Hematological: Negative for other adenopathy or swelling Psychiatric/Behavioral: Negative for hallucinations, other worsening agitation, SI, self-injury, or new decreased concentration All other system neg per pt    Objective:   Physical Exam BP 116/76   Pulse 78   Temp 98 F (36.7 C)   Ht 6' (1.829 m)   Wt 200 lb (90.7 kg)  SpO2 98%   BMI 27.12 kg/m  VS noted,  Constitutional: Pt is oriented to person, place, and time. Appears well-developed and well-nourished, in no significant distress and comfortable Head: Normocephalic and atraumatic  Eyes: Conjunctivae and EOM are normal. Pupils are equal, round, and reactive to light Right Ear: External ear normal without  discharge Left Ear: External ear normal without discharge Nose: Nose without discharge or deformity Mouth/Throat: Oropharynx is without other ulcerations and moist  Neck: Normal range of motion. Neck supple. No JVD present. No tracheal deviation present or significant neck LA or mass Cardiovascular: Normal rate, regular rhythm, normal heart sounds and intact distal pulses.  Pulmonary/Chest: WOB normal and breath sounds without rales or wheezing  Abdominal: Soft. Bowel sounds are normal. NT. No HSM  Musculoskeletal: Normal range of motion. Exhibits no edema Lymphadenopathy: Has no other cervical adenopathy.  Neurological: Pt is alert and oriented to person, place, and time. Pt has normal reflexes. No cranial nerve deficit. Motor grossly intact, Gait intact Skin: Skin is warm and dry. No rash noted or new ulcerations Psychiatric:  Has normal mood and affect. Behavior is normal without agitation No other exam findings Lab Results  Component Value Date   WBC 6.8 04/05/2012   HGB 12.8 04/05/2012   HCT 38.0 04/05/2012   PLT 272.0 04/05/2012   GLUCOSE 85 04/05/2012   CHOL 184 04/05/2012   TRIG 72.0 04/05/2012   HDL 61.30 04/05/2012   LDLCALC 108 (H) 04/05/2012   ALT 14 04/05/2012   AST 19 04/05/2012   NA 137 04/05/2012   K 3.5 04/05/2012   CL 106 04/05/2012   CREATININE 0.6 04/05/2012   BUN 10 04/05/2012   CO2 25 04/05/2012   TSH 1.75 04/05/2012       Assessment & Plan:

## 2017-04-19 NOTE — Patient Instructions (Signed)

## 2017-04-19 NOTE — Assessment & Plan Note (Signed)

## 2017-07-24 ENCOUNTER — Telehealth: Payer: Self-pay | Admitting: Internal Medicine

## 2017-07-24 MED ORDER — AMPHETAMINE-DEXTROAMPHET ER 20 MG PO CP24: 20.0000 mg | ORAL_CAPSULE | ORAL | 0 refills | Status: DC

## 2017-07-24 NOTE — Telephone Encounter (Signed)
Done erx 

## 2017-07-24 NOTE — Telephone Encounter (Signed)
Pt called for a refill of her amphetamine-dextroamphetamine (ADDERALL XR) 20 MG 24 hr capsule  Please advise, Pt states she does not take everyday

## 2017-08-14 ENCOUNTER — Other Ambulatory Visit: Payer: Self-pay | Admitting: Internal Medicine

## 2017-08-14 MED ORDER — AMPHETAMINE-DEXTROAMPHET ER 20 MG PO CP24: 20.0000 mg | ORAL_CAPSULE | ORAL | 0 refills | Status: DC

## 2017-08-14 NOTE — Telephone Encounter (Signed)
Done erx 

## 2017-08-14 NOTE — Telephone Encounter (Signed)
Patient requesting refill on adderall  °

## 2017-08-15 MED ORDER — AMPHETAMINE-DEXTROAMPHET ER 20 MG PO CP24: 20.0000 mg | ORAL_CAPSULE | ORAL | 0 refills | Status: DC

## 2017-08-15 NOTE — Telephone Encounter (Signed)
This was redone

## 2017-08-15 NOTE — Telephone Encounter (Addendum)
Pt states her amphetamine-dextroamphetamine (ADDERALL XR) 20 MG 24 hr capsule Was called into the wrong pharmacy and they will not transfer.   Please resend to :  [facility] [address] [place] - [address] [phone] (Phone) [phone] (Fax)

## 2017-08-15 NOTE — Addendum Note (Signed)
Addended by: Corwin LevinsJOHN, Valta Dillon W on: 08/15/2017 05:35 PM   Modules accepted: Orders

## 2017-10-17 ENCOUNTER — Other Ambulatory Visit: Payer: Self-pay | Admitting: Internal Medicine

## 2017-10-17 MED ORDER — AMPHETAMINE-DEXTROAMPHET ER 20 MG PO CP24: 20.0000 mg | ORAL_CAPSULE | ORAL | 0 refills | Status: DC

## 2017-10-17 NOTE — Addendum Note (Signed)
Addended by: Corwin LevinsJOHN, Agustina Witzke W on: 10/17/2017 01:12 PM   Modules accepted: Orders

## 2017-10-17 NOTE — Telephone Encounter (Signed)
Rx refill request for provider review:    Adderall XR 20 mg  LOV: 04/19/17  [facility]: [facility] [address]  Copied from CRM [phone]. Topic: Quick Communication - Rx Refill/Question >> Oct 17, 2017 12:42 PM Floria RavelingStovall, Shana A wrote: Medication:  amphetamine-dextroamphetamine (ADDERALL XR) 20 MG 24 hr capsule [[id number]][[id number]] ENDED   Has the patient contacted their pharmacy? No    (Agent: If no, request that the patient contact the pharmacy for the refill.)   Preferred Pharmacy (with phone number [facility] street name):[facility] [address]    Agent: Please be advised that RX refills may take up to 3 business days. We ask that you follow-up with your pharmacy.

## 2017-10-17 NOTE — Telephone Encounter (Signed)
Done erx 

## 2017-12-26 ENCOUNTER — Other Ambulatory Visit: Payer: Self-pay | Admitting: Internal Medicine

## 2017-12-26 NOTE — Telephone Encounter (Signed)
Copied from CRM [id number]. Topic: Quick Communication - Rx Refill/Question >> Dec 26, 2017 12:55 PM Eston Mould B wrote: Medication: amphetamine-dextroamphetamine (ADDERALL XR) 20 MG 24 hr capsule   Has the patient contacted their pharmacy? Yes  (Agent: If no, request that the patient contact the pharmacy for the refill.)  Preferred Pharmacy (with phone number or street name): [id number] [place], [place] - [phone] AT [facility] [address] [facility] [address] & [address] [phone] (Phone) [phone] (Fax)      Agent: Please be advised that RX refills may take up to 3 business days. We ask that you follow-up with your pharmacy.

## 2017-12-26 NOTE — Telephone Encounter (Signed)
Refill of Adderall  LOV 04/19/17  Dr. Jonny Ruiz  [facility] 10/17/17  #30   0 refills.   [id number] - [place], [place] - [address]       [phone] (Phone) [phone] (Fax)

## 2017-12-27 MED ORDER — AMPHETAMINE-DEXTROAMPHET ER 20 MG PO CP24: 20.0000 mg | ORAL_CAPSULE | ORAL | 0 refills | Status: DC

## 2017-12-27 NOTE — Telephone Encounter (Signed)
Done erx 

## 2017-12-27 NOTE — Telephone Encounter (Signed)
10/17/2017 30# 

## 2018-02-19 ENCOUNTER — Telehealth: Payer: Self-pay | Admitting: Internal Medicine

## 2018-02-19 MED ORDER — AMPHETAMINE-DEXTROAMPHET ER 20 MG PO CP24: 20.0000 mg | ORAL_CAPSULE | ORAL | 0 refills | Status: DC

## 2018-02-19 NOTE — Telephone Encounter (Signed)
Copied from CRM [id number]. Topic: Quick Communication - Rx Refill/Question >> Feb 19, 2018  9:10 AM Burchel, Abbi R wrote: Medication: amphetamine-dextroamphetamine (ADDERALL XR) 20 MG 24 hr capsule  Has the patient contacted their pharmacy? No.  Preferred Pharmacy: [facility] [address], [place], [place] [id number]   ph. [phone]  Pt will be visiting family in [place] and would like this Rx sent to a pharmacy nearby during her visit.   Pt was advised that RX refills may take up to 3 business days.

## 2018-02-19 NOTE — Telephone Encounter (Signed)
Done erx 

## 2018-04-10 ENCOUNTER — Telehealth: Payer: Self-pay | Admitting: Internal Medicine

## 2018-04-10 NOTE — Telephone Encounter (Signed)
Copied from CRM [id number]. Topic: Quick Communication - Rx Refill/Question >> Apr 10, 2018  4:24 PM Maia PettiesOrtiz, Kristie S wrote: Medication: amphetamine-dextroamphetamine (ADDERALL XR) 20 MG 24 hr capsule - pt will take last dose Thursday 04/12/18  Has the patient contacted their pharmacy? Yes - will not send request for controlled medication Preferred Pharmacy (with phone number or street name): [id number] [place], [place] - [phone] AT [facility] [address] [facility] [address] [phone] (Phone) [phone] (Fax)

## 2018-04-11 MED ORDER — AMPHETAMINE-DEXTROAMPHET ER 20 MG PO CP24
20.0000 mg | ORAL_CAPSULE | ORAL | 0 refills | Status: DC
Start: 2018-04-11 — End: 2018-06-04

## 2018-04-11 NOTE — Telephone Encounter (Signed)
MD approved and sent electronically to pof../lmb  

## 2018-04-11 NOTE — Telephone Encounter (Signed)
Done erx 

## 2018-06-04 ENCOUNTER — Telehealth: Payer: Self-pay | Admitting: Internal Medicine

## 2018-06-04 MED ORDER — AMPHETAMINE-DEXTROAMPHET ER 20 MG PO CP24: 20.0000 mg | ORAL_CAPSULE | ORAL | 0 refills | Status: DC

## 2018-06-04 NOTE — Telephone Encounter (Signed)
Done erx  But please ask pt for yearly OV for further refills

## 2018-06-04 NOTE — Telephone Encounter (Signed)
Copied from CRM [id number]. Topic: General - Other >> Jun 04, 2018  1:01 PM Leafy Ro wrote: Reason for CRM: pt needs new rx generic adderall xr 20 mg. Pt said pharm told her to call [facility] md [facility]. [facility] [address]

## 2018-06-05 NOTE — Telephone Encounter (Signed)
Tried calling pt no answer and can't leave msg due to vm being full. Will send pt a mychart message letting her know MD response.Marland KitchenRaechel Chute

## 2018-08-01 ENCOUNTER — Other Ambulatory Visit: Payer: Self-pay | Admitting: Internal Medicine

## 2018-08-01 NOTE — Telephone Encounter (Unsigned)
Copied from CRM [id number]. Topic: Quick Communication - Rx Refill/Question >> Aug 01, 2018  8:29 AM Windy KalataMichael, Alexis Hoover, Alexis Hoover wrote: Medication: amphetamine-dextroamphetamine (ADDERALL XR) 20 MG 24 hr capsule Has the patient contacted their pharmacy? Yes.  Was told due to controlled substance had to contact office.  Patient has a physical on 09/10/18   Preferred Pharmacy (with phone number or street name):  [id number] [place], [place] - [phone] [facility] [address] [facility] [address] [place] [address] Phone: [phone] Fax: [phone]    Agent: Please be advised that RX refills may take up to 3 business days. We ask that you follow-up with your pharmacy.

## 2018-08-01 NOTE — Telephone Encounter (Signed)
adderall unable to be refilled as per office policy  Ok to let pt know, must make ROV for further

## 2018-08-01 NOTE — Telephone Encounter (Signed)
Requested medication (s) are due for refill today -yes  Requested medication (s) are on the active medication list -yes  Future visit scheduled -yes  Last refill: 06/04/18  Notes to clinic: Patient is requesting a non-delegated medication for refill- patient has appointment scheduled for 09/10/18. Sent for provider review   Requested Prescriptions  Pending Prescriptions Disp Refills   amphetamine-dextroamphetamine (ADDERALL XR) 20 MG 24 hr capsule 30 capsule 0    Sig: Take 1 capsule (20 mg total) by mouth every morning.     Not Delegated - Psychiatry:  Stimulants/ADHD Failed - 08/01/2018 10:23 AM      Failed - This refill cannot be delegated      Failed - Urine Drug Screen completed in last 360 days.      Failed - Valid encounter within last 3 months    Recent Outpatient Visits          1 year ago Preventative health care   [facility], James W, MD   2 years ago Preventative health care   [facility] John, James W, MD   3 years ago Attention deficit disorder   [facility] John, James W, MD   5 years ago Preventative health care   [facility] John, James W, MD   6 years ago Preventative health care   [facility] John, James W, MD      Future Appointments            In 1 month Jonny RuizJohn, Len BlalockJames W, MD [facility], [facility]            Requested Prescriptions  Pending Prescriptions Disp Refills   amphetamine-dextroamphetamine (ADDERALL XR) 20 MG 24 hr capsule 30 capsule 0    Sig: Take 1 capsule (20 mg total) by mouth every morning.     Not Delegated - Psychiatry:  Stimulants/ADHD Failed - 08/01/2018 10:23 AM      Failed - This refill cannot be delegated      Failed - Urine Drug Screen completed in last 360 days.      Failed - Valid encounter within last 3 months    Recent Outpatient Visits          1 year ago  Preventative health care   [facility], James W, MD   2 years ago Preventative health care   [facility] John, James W, MD   3 years ago Attention deficit disorder   [facility] John, Len BlalockJames W, MD   5 years ago Preventative health care   [facility] John, Len BlalockJames W, MD   6 years ago Preventative health care   [facility] [place] John, James W, MD      Future Appointments            In 1 month Jonny RuizJohn, Len BlalockJames W, MD [facility] -[place], [facility]

## 2018-08-07 ENCOUNTER — Other Ambulatory Visit: Payer: Self-pay | Admitting: Internal Medicine

## 2018-08-07 NOTE — Telephone Encounter (Signed)
adderall declined refill due to being over the time allowed per office policy

## 2018-08-17 ENCOUNTER — Encounter: Payer: BC Managed Care – PPO | Admitting: Internal Medicine

## 2018-08-21 ENCOUNTER — Encounter: Payer: Self-pay | Admitting: Internal Medicine

## 2018-08-21 ENCOUNTER — Other Ambulatory Visit (INDEPENDENT_AMBULATORY_CARE_PROVIDER_SITE_OTHER): Payer: BC Managed Care – PPO

## 2018-08-21 ENCOUNTER — Ambulatory Visit: Payer: BC Managed Care – PPO | Admitting: Internal Medicine

## 2018-08-21 VITALS — BP 116/72 | HR 99 | Temp 97.9°F | Ht 72.0 in | Wt 230.0 lb

## 2018-08-21 DIAGNOSIS — F988 Other specified behavioral and emotional disorders with onset usually occurring in childhood and adolescence: Secondary | ICD-10-CM | POA: Diagnosis not present

## 2018-08-21 DIAGNOSIS — Z0000 Encounter for general adult medical examination without abnormal findings: Secondary | ICD-10-CM | POA: Diagnosis not present

## 2018-08-21 LAB — HEPATIC FUNCTION PANEL
ALT: 14 U/L (ref 0–35)
AST: 17 U/L (ref 0–37)
Albumin: 4 g/dL (ref 3.5–5.2)
Alkaline Phosphatase: 46 U/L (ref 39–117)
Bilirubin, Direct: 0.1 mg/dL (ref 0.0–0.3)
Total Bilirubin: 0.4 mg/dL (ref 0.2–1.2)
Total Protein: 7 g/dL (ref 6.0–8.3)

## 2018-08-21 LAB — URINALYSIS, ROUTINE W REFLEX MICROSCOPIC
Bilirubin Urine: NEGATIVE
Hgb urine dipstick: NEGATIVE
Ketones, ur: NEGATIVE
LEUKOCYTES UA: NEGATIVE
Nitrite: NEGATIVE
Specific Gravity, Urine: 1.015 (ref 1.000–1.030)
TOTAL PROTEIN, URINE-UPE24: NEGATIVE
Urine Glucose: NEGATIVE
Urobilinogen, UA: 0.2 (ref 0.0–1.0)
pH: 7 (ref 5.0–8.0)

## 2018-08-21 LAB — BASIC METABOLIC PANEL
BUN: 10 mg/dL (ref 6–23)
CHLORIDE: 104 meq/L (ref 96–112)
CO2: 28 meq/L (ref 19–32)
Calcium: 9 mg/dL (ref 8.4–10.5)
Creatinine, Ser: 0.82 mg/dL (ref 0.40–1.20)
GFR: 82.96 mL/min (ref >60.00)
Glucose, Bld: 80 mg/dL (ref 70–99)
Potassium: 4.7 mEq/L (ref 3.5–5.1)
Sodium: 137 mEq/L (ref 135–145)

## 2018-08-21 LAB — LIPID PANEL
CHOL/HDL RATIO: 3
Cholesterol: 181 mg/dL (ref 0–200)
HDL: 63.5 mg/dL (ref >39.00)
LDL Cholesterol: 96 mg/dL (ref 0–99)
NonHDL: 117
Triglycerides: 105 mg/dL (ref 0.0–149.0)
VLDL: 21 mg/dL (ref 0.0–40.0)

## 2018-08-21 LAB — CBC WITH DIFFERENTIAL/PLATELET
Basophils Absolute: 0.1 10*3/uL (ref 0.0–0.1)
Basophils Relative: 0.8 % (ref 0.0–3.0)
Eosinophils Absolute: 0.1 10*3/uL (ref 0.0–0.7)
Eosinophils Relative: 1.9 % (ref 0.0–5.0)
HCT: 39.6 % (ref 36.0–46.0)
Hemoglobin: 13.3 g/dL (ref 12.0–15.0)
Lymphocytes Relative: 43.4 % (ref 12.0–46.0)
Lymphs Abs: 3 10*3/uL (ref 0.7–4.0)
MCHC: 33.6 g/dL (ref 30.0–36.0)
MCV: 88.7 fl (ref 78.0–100.0)
MONOS PCT: 8 % (ref 3.0–12.0)
Monocytes Absolute: 0.6 10*3/uL (ref 0.1–1.0)
Neutro Abs: 3.2 10*3/uL (ref 1.4–7.7)
Neutrophils Relative %: 45.9 % (ref 43.0–77.0)
PLATELETS: 317 10*3/uL (ref 150.0–400.0)
RBC: 4.46 Mil/uL (ref 3.87–5.11)
RDW: 12.5 % (ref 11.5–15.5)
WBC: 7 10*3/uL (ref 4.0–10.5)

## 2018-08-21 LAB — TSH: TSH: 2.25 u[IU]/mL (ref 0.35–4.50)

## 2018-08-21 MED ORDER — AMPHETAMINE-DEXTROAMPHET ER 20 MG PO CP24: 20.0000 mg | ORAL_CAPSULE | ORAL | 0 refills | Status: DC

## 2018-08-21 NOTE — Patient Instructions (Signed)

## 2018-08-21 NOTE — Progress Notes (Signed)
Subjective:    Patient ID: Alexis Hoover, female    DOB: 1981-08-10, 37 y.o.   MRN: [id number]  HPI  Here for wellness and f/u;  Overall doing ok;  Pt denies Chest pain, worsening SOB, DOE, wheezing, orthopnea, PND, worsening LE edema, palpitations, dizziness or syncope.  Pt denies neurological change such as new headache, facial or extremity weakness.  Pt denies polydipsia, polyuria, or low sugar symptoms. Pt states overall good compliance with treatment and medications, good tolerability, and has been trying to follow appropriate diet.  Pt denies worsening depressive symptoms, suicidal ideation or panic. No fever, night sweats, wt loss, loss of appetite, or other constitutional symptoms.  Pt states good ability with ADL's, has low fall risk, home safety reviewed and adequate, no other significant changes in hearing or vision, and only occasionally active with exercise.  ADD med working well for social and work function  No new complaints Past Medical History:  Diagnosis Date  . Abnormal Pap smear    LEEP  . Lumbar disc disease 04/19/2017  . No pertinent past medical history    Past Surgical History:  Procedure Laterality Date  . LEEP      reports that she has never smoked. She has never used smokeless tobacco. She reports current alcohol use. She reports that she does not use drugs. family history includes Asthma in her sister; Cancer in her paternal grandfather; Diabetes in her maternal grandmother; Hypertension in her mother; Stroke in her maternal grandmother; Vision loss in her maternal grandmother. No Known Allergies Current Outpatient Medications on File Prior to Visit  Medication Sig Dispense Refill  . levonorgestrel-ethinyl estradiol (AVIANE,ALESSE,LESSINA) 0.1-20 MG-MCG tablet Take 1 tablet by mouth daily. Reported on 12/10/2015    . MONO-LINYAH 0.25-35 MG-MCG tablet   1   No current facility-administered medications on file prior to visit.    Review of  Systems Constitutional: Negative for other unusual diaphoresis, sweats, appetite or weight changes HENT: Negative for other worsening hearing loss, ear pain, facial swelling, mouth sores or neck stiffness.   Eyes: Negative for other worsening pain, redness or other visual disturbance.  Respiratory: Negative for other stridor or swelling Cardiovascular: Negative for other palpitations or other chest pain  Gastrointestinal: Negative for worsening diarrhea or loose stools, blood in stool, distention or other pain Genitourinary: Negative for hematuria, flank pain or other change in urine volume.  Musculoskeletal: Negative for myalgias or other joint swelling.  Skin: Negative for other color change, or other wound or worsening drainage.  Neurological: Negative for other syncope or numbness. Hematological: Negative for other adenopathy or swelling Psychiatric/Behavioral: Negative for hallucinations, other worsening agitation, SI, self-injury, or new decreased concentration All other system neg per pt    Objective:   Physical Exam BP 116/72   Pulse 99   Temp 97.9 F (36.6 C) (Oral)   Ht 6' (1.829 m)   Wt 230 lb (104.3 kg)   SpO2 97%   BMI 31.19 kg/m  VS noted,  Constitutional: Pt is oriented to person, place, and time. Appears well-developed and well-nourished, in no significant distress and comfortable Head: Normocephalic and atraumatic  Eyes: Conjunctivae and EOM are normal. Pupils are equal, round, and reactive to light Right Ear: External ear normal without discharge Left Ear: External ear normal without discharge Nose: Nose without discharge or deformity Mouth/Throat: Oropharynx is without other ulcerations and moist  Neck: Normal range of motion. Neck supple. No JVD present. No tracheal deviation present or significant neck LA or mass  Cardiovascular: Normal rate, regular rhythm, normal heart sounds and intact distal pulses.   Pulmonary/Chest: WOB normal and breath sounds without  rales or wheezing  Abdominal: Soft. Bowel sounds are normal. NT. No HSM  Musculoskeletal: Normal range of motion. Exhibits no edema Lymphadenopathy: Has no other cervical adenopathy.  Neurological: Pt is alert and oriented to person, place, and time. Pt has normal reflexes. No cranial nerve deficit. Motor grossly intact, Gait intact Skin: Skin is warm and dry. No rash noted or new ulcerations Psychiatric:  Has normal mood and affect. Behavior is normal without agitation No other exam findings  No recent labs    Assessment & Plan:

## 2018-08-21 NOTE — Assessment & Plan Note (Signed)

## 2018-08-21 NOTE — Assessment & Plan Note (Signed)
Stable, cont adderall asd,  to f/u any worsening symptoms or concerns 

## 2018-09-10 ENCOUNTER — Encounter: Payer: BC Managed Care – PPO | Admitting: Internal Medicine

## 2018-10-16 ENCOUNTER — Other Ambulatory Visit: Payer: Self-pay | Admitting: Internal Medicine

## 2018-10-16 MED ORDER — AMPHETAMINE-DEXTROAMPHET ER 20 MG PO CP24: 20.0000 mg | ORAL_CAPSULE | ORAL | 0 refills | Status: DC

## 2018-10-16 NOTE — Telephone Encounter (Signed)
Copied from CRM [id number]. Topic: General - Other >> Oct 16, 2018 10:20 AM Leafy Ro wrote: Reason for CRM: Pt needs a refill on generic adderall xr 20 mg. [facility] [address]

## 2018-10-16 NOTE — Telephone Encounter (Signed)
Done erx 

## 2019-05-03 ENCOUNTER — Ambulatory Visit (INDEPENDENT_AMBULATORY_CARE_PROVIDER_SITE_OTHER): Payer: BC Managed Care – PPO | Admitting: Internal Medicine

## 2019-05-03 ENCOUNTER — Encounter: Payer: Self-pay | Admitting: Internal Medicine

## 2019-05-03 DIAGNOSIS — Z0000 Encounter for general adult medical examination without abnormal findings: Secondary | ICD-10-CM

## 2019-05-03 DIAGNOSIS — F988 Other specified behavioral and emotional disorders with onset usually occurring in childhood and adolescence: Secondary | ICD-10-CM | POA: Diagnosis not present

## 2019-05-03 MED ORDER — AMPHETAMINE-DEXTROAMPHET ER 20 MG PO CP24: 20.0000 mg | ORAL_CAPSULE | ORAL | 0 refills | Status: DC

## 2019-05-03 NOTE — Assessment & Plan Note (Signed)
stable overall by history and exam, recent data reviewed with pt, and pt to continue medical treatment as before,  to f/u any worsening symptoms or concerns  

## 2019-05-03 NOTE — Assessment & Plan Note (Signed)

## 2019-05-03 NOTE — Progress Notes (Signed)
Patient ID: Alexis Hoover, female   DOB: 1981/07/30, 38 y.o.   MRN: [id number]  Virtual Visit via Video Note  I connected with Alexis Hoover on 05/03/19 at  8:00 AM EDT by a video enabled telemedicine application and verified that I am speaking with the correct person using two identifiers.  Location: Patient: at home Provider: at office   I discussed the limitations of evaluation and management by telemedicine and the availability of in person appointments. The patient expressed understanding and agreed to proceed.  History of Present Illness: Here for wellness and f/u;  Overall doing ok;  Pt denies Chest pain, worsening SOB, DOE, wheezing, orthopnea, PND, worsening LE edema, palpitations, dizziness or syncope.  Pt denies neurological change such as new headache, facial or extremity weakness.  Pt denies polydipsia, polyuria, or low sugar symptoms. Pt states overall good compliance with treatment and medications, good tolerability, and has been trying to follow appropriate diet.  Pt denies worsening depressive symptoms, suicidal ideation or panic. No fever, night sweats, wt loss, loss of appetite, or other constitutional symptoms.  Pt states good ability with ADL's, has low fall risk, home safety reviewed and adequate, no other significant changes in hearing or vision, and only occasionally active with exercise. ADD med working well and able to focus well and task completion on current dosing   Observations/Objective: Alert, NAD, appropriate mood and affect, resps normal, cn 2-12 intact, moves all 4s, no visible rash or swelling Lab Results  Component Value Date   WBC 7.0 08/21/2018   HGB 13.3 08/21/2018   HCT 39.6 08/21/2018   PLT 317.0 08/21/2018   GLUCOSE 80 08/21/2018   CHOL 181 08/21/2018   TRIG 105.0 08/21/2018   HDL 63.50 08/21/2018   LDLCALC 96 08/21/2018   ALT 14 08/21/2018   AST 17 08/21/2018   NA 137 08/21/2018   K 4.7 08/21/2018   CL 104 08/21/2018   CREATININE  0.82 08/21/2018   BUN 10 08/21/2018   CO2 28 08/21/2018   TSH 2.25 08/21/2018   Assessment and Plan: See notes  Follow Up Instructions: See notes   I discussed the assessment and treatment plan with the patient. The patient was provided an opportunity to ask questions and all were answered. The patient agreed with the plan and demonstrated an understanding of the instructions.   The patient was advised to call back or seek an in-person evaluation if the symptoms worsen or if the condition fails to improve as anticipated.  Cathlean Cower, MD

## 2019-05-03 NOTE — Patient Instructions (Signed)
Please continue all other medications as before, and refills have been done if requested.  Please have the pharmacy call with any other refills you may need.  Please continue your efforts at being more active, low cholesterol diet, and weight control.  You are otherwise up to date with prevention measures today.  Please keep your appointments with your specialists as you may have planned   

## 2019-05-16 ENCOUNTER — Telehealth: Payer: Self-pay | Admitting: Internal Medicine

## 2019-05-16 NOTE — Telephone Encounter (Signed)
Ok to let pt know, todays request for adderall too soon, since the last rx was done sept 4, and not due again to october

## 2019-05-16 NOTE — Telephone Encounter (Signed)
Medication Refill - Medication: amphetamine-dextroamphetamine (ADDERALL XR) 20 MG 24 hr capsule   Preferred Pharmacy (with phone number or street name):  [id number] [place], [place] [phone] (Phone) [phone] (Fax)

## 2019-05-16 NOTE — Telephone Encounter (Signed)
Requested medication (s) are due for refill today: yes  Requested medication (s) are on the active medication list:  yes  Last refill:  05/03/2019  Future visit scheduled: yes  Notes to clinic:  Review for refill   Requested Prescriptions  Pending Prescriptions Disp Refills   amphetamine-dextroamphetamine (ADDERALL XR) 20 MG 24 hr capsule 30 capsule 0    Sig: Take 1 capsule (20 mg total) by mouth every morning.     Not Delegated - Psychiatry:  Stimulants/ADHD Failed - 05/16/2019 11:31 AM      Failed - This refill cannot be delegated      Failed - Urine Drug Screen completed in last 360 days.      Passed - Valid encounter within last 3 months    Recent Outpatient Visits          1 week ago Preventative health care   [facility] -[place], Hunt Oris, MD   8 months ago Preventative health care   [facility] -[place], Hunt Oris, MD   2 years ago Preventative health care   [facility] -[place]ame, Hunt Oris, MD   3 years ago Preventative health care   [facility] -[place]es Mouse, MD   4 years ago Attention deficit disorder   [employer] [facility] -[place]es Mouse, MD      Future Appointments            In 3 months Jenny Reichmann, Hunt Oris, MD [place], [facility]

## 2019-06-18 NOTE — Telephone Encounter (Signed)
Pt would also like to note she was unable to pick up rx from 05/03/2019.

## 2019-06-18 NOTE — Telephone Encounter (Signed)
Pt states this rx needs Prior Auth.  Pt has spoken to [employer] and they will be sending it over form today.   [phone] (Prior Auth for [employer])

## 2019-06-20 NOTE — Telephone Encounter (Signed)
I will start PA

## 2019-06-21 NOTE — Telephone Encounter (Addendum)
Key: [id number]   BIN: [id number] PCN: [id number] GRP: [id number]  ID: [id number]

## 2019-06-24 NOTE — Telephone Encounter (Signed)
PA was approved.   LVM for pt informing of same.

## 2019-08-20 ENCOUNTER — Other Ambulatory Visit: Payer: Self-pay | Admitting: Internal Medicine

## 2019-08-20 MED ORDER — AMPHETAMINE-DEXTROAMPHET ER 20 MG PO CP24: 20.0000 mg | ORAL_CAPSULE | ORAL | 0 refills | Status: DC

## 2019-08-20 NOTE — Telephone Encounter (Signed)
Requested medication (s) are due for refill today: yes  Requested medication (s) are on the active medication list: yes  Last refill:  05/03/2019  Future visit scheduled: yes  Notes to clinic:  This refill cannot be delegated    Requested Prescriptions  Pending Prescriptions Disp Refills   amphetamine-dextroamphetamine (ADDERALL XR) 20 MG 24 hr capsule 30 capsule 0    Sig: Take 1 capsule (20 mg total) by mouth every morning.      Not Delegated - Psychiatry:  Stimulants/ADHD Failed - 08/20/2019  9:22 AM      Failed - This refill cannot be delegated      Failed - Urine Drug Screen completed in last 360 days.      Failed - Valid encounter within last 3 months    Recent Outpatient Visits           3 months ago Preventative health care   [facility] -[place]ame, Hunt Oris, MD   12 months ago Preventative health care   [facility] -[place] Bame, Hunt Oris, MD   2 years ago Preventative health care   [facility] -[place] Bame, Hunt Oris, MD   3 years ago Preventative health care   [facility] -Georges Mouse, MD   5 years ago Attention deficit disorder   [place], Hunt Oris, MD       Future Appointments             Tomorrow Biagio Borg, MD [place] at [facility]

## 2019-08-20 NOTE — Telephone Encounter (Signed)
Done erx 

## 2019-08-20 NOTE — Telephone Encounter (Signed)
Medication Refill - Medication: adderall   Has the patient contacted their pharmacy? Yes.   (Agent: If no, request that the patient contact the pharmacy for the refill.) (Agent: If yes, when and what did the pharmacy advise?)  Preferred Pharmacy (with phone number or street name):  [id number] [place], [place]  [address] [place] [address]  Phone: [phone] Fax: [phone]  Not a 24 hour pharmacy; exact hours not known.     Agent: Please be advised that RX refills may take up to 3 business days. We ask that you follow-up with your pharmacy.

## 2019-08-21 ENCOUNTER — Other Ambulatory Visit: Payer: Self-pay

## 2019-08-21 ENCOUNTER — Ambulatory Visit (INDEPENDENT_AMBULATORY_CARE_PROVIDER_SITE_OTHER): Payer: BC Managed Care – PPO | Admitting: Internal Medicine

## 2019-08-21 ENCOUNTER — Encounter: Payer: Self-pay | Admitting: Internal Medicine

## 2019-08-21 VITALS — BP 120/84 | HR 93 | Temp 98.0°F | Ht 72.0 in | Wt 236.0 lb

## 2019-08-21 DIAGNOSIS — E785 Hyperlipidemia, unspecified: Secondary | ICD-10-CM | POA: Insufficient documentation

## 2019-08-21 DIAGNOSIS — F411 Generalized anxiety disorder: Secondary | ICD-10-CM

## 2019-08-21 DIAGNOSIS — F988 Other specified behavioral and emotional disorders with onset usually occurring in childhood and adolescence: Secondary | ICD-10-CM | POA: Diagnosis not present

## 2019-08-21 DIAGNOSIS — E669 Obesity, unspecified: Secondary | ICD-10-CM | POA: Insufficient documentation

## 2019-08-21 DIAGNOSIS — Z0000 Encounter for general adult medical examination without abnormal findings: Secondary | ICD-10-CM

## 2019-08-21 LAB — CBC WITH DIFFERENTIAL/PLATELET
Basophils Absolute: 0.1 10*3/uL (ref 0.0–0.1)
Basophils Relative: 1.1 % (ref 0.0–3.0)
Eosinophils Absolute: 0.1 10*3/uL (ref 0.0–0.7)
Eosinophils Relative: 1.7 % (ref 0.0–5.0)
HCT: 40.2 % (ref 36.0–46.0)
Hemoglobin: 13.3 g/dL (ref 12.0–15.0)
Lymphocytes Relative: 41.4 % (ref 12.0–46.0)
Lymphs Abs: 2.7 10*3/uL (ref 0.7–4.0)
MCHC: 33.1 g/dL (ref 30.0–36.0)
MCV: 89.7 fl (ref 78.0–100.0)
Monocytes Absolute: 0.6 10*3/uL (ref 0.1–1.0)
Monocytes Relative: 8.9 % (ref 3.0–12.0)
Neutro Abs: 3 10*3/uL (ref 1.4–7.7)
Neutrophils Relative %: 46.9 % (ref 43.0–77.0)
Platelets: 329 10*3/uL (ref 150.0–400.0)
RBC: 4.48 Mil/uL (ref 3.87–5.11)
RDW: 12.8 % (ref 11.5–15.5)
WBC: 6.5 10*3/uL (ref 4.0–10.5)

## 2019-08-21 LAB — LIPID PANEL
Cholesterol: 212 mg/dL — ABNORMAL HIGH (ref 0–200)
HDL: 63.4 mg/dL (ref >39.00)
LDL Cholesterol: 133 mg/dL — ABNORMAL HIGH (ref 0–99)
NonHDL: 148.6
Total CHOL/HDL Ratio: 3
Triglycerides: 80 mg/dL (ref 0.0–149.0)
VLDL: 16 mg/dL (ref 0.0–40.0)

## 2019-08-21 LAB — URINALYSIS, ROUTINE W REFLEX MICROSCOPIC
Bilirubin Urine: NEGATIVE
Hgb urine dipstick: NEGATIVE
Ketones, ur: NEGATIVE
Leukocytes,Ua: NEGATIVE
Nitrite: NEGATIVE
RBC / HPF: NONE SEEN (ref 0–?)
Specific Gravity, Urine: 1.02 (ref 1.000–1.030)
Total Protein, Urine: NEGATIVE
Urine Glucose: NEGATIVE
Urobilinogen, UA: 0.2 (ref 0.0–1.0)
pH: 6 (ref 5.0–8.0)

## 2019-08-21 LAB — HEPATIC FUNCTION PANEL
ALT: 13 U/L (ref 0–35)
AST: 17 U/L (ref 0–37)
Albumin: 4.2 g/dL (ref 3.5–5.2)
Alkaline Phosphatase: 61 U/L (ref 39–117)
Bilirubin, Direct: 0.1 mg/dL (ref 0.0–0.3)
Total Bilirubin: 0.5 mg/dL (ref 0.2–1.2)
Total Protein: 6.8 g/dL (ref 6.0–8.3)

## 2019-08-21 LAB — BASIC METABOLIC PANEL
BUN: 10 mg/dL (ref 6–23)
CO2: 25 mEq/L (ref 19–32)
Calcium: 9.1 mg/dL (ref 8.4–10.5)
Chloride: 103 mEq/L (ref 96–112)
Creatinine, Ser: 0.69 mg/dL (ref 0.40–1.20)
GFR: 94.76 mL/min (ref >60.00)
Glucose, Bld: 85 mg/dL (ref 70–99)
Potassium: 4.1 mEq/L (ref 3.5–5.1)
Sodium: 136 mEq/L (ref 135–145)

## 2019-08-21 LAB — TSH: TSH: 1.54 u[IU]/mL (ref 0.35–4.50)

## 2019-08-21 NOTE — Progress Notes (Signed)
Subjective:    Patient ID: Alexis Hoover, female    DOB: [birth year], 38 y.o.   MRN: [id number]  HPI  Here to f/u; overall doing ok,  Pt denies chest pain, increasing sob or doe, wheezing, orthopnea, PND, increased LE swelling, palpitations, dizziness or syncope.  Pt denies new neurological symptoms such as new headache, or facial or extremity weakness or numbness.  Pt denies polydipsia, polyuria, or low sugar episode.  Pt states overall good compliance with meds, mostly trying to follow appropriate diet, with wt overall stable,  but little exercise however. Has gained wt with pandemic, hard to lose.  Denies worsening depressive symptoms, suicidal ideation, or panic  Wt Readings from Last 3 Encounters:  08/21/19 236 lb (107 kg)  08/21/18 230 lb (104.3 kg)  04/19/17 200 lb (90.7 kg)   Past Medical History:  Diagnosis Date  . Abnormal Pap smear    LEEP  . Lumbar disc disease 04/19/2017  . No pertinent past medical history    Past Surgical History:  Procedure Laterality Date  . LEEP      reports that she has never smoked. She has never used smokeless tobacco. She reports current alcohol use. She reports that she does not use drugs. family history includes Asthma in her sister; Cancer in her paternal grandfather; Diabetes in her maternal grandmother; Hypertension in her mother; Stroke in her maternal grandmother; Vision loss in her maternal grandmother. No Known Allergies Current Outpatient Medications on File Prior to Visit  Medication Sig Dispense Refill  . amphetamine-dextroamphetamine (ADDERALL XR) 20 MG 24 hr capsule Take 1 capsule (20 mg total) by mouth every morning. 30 capsule 0  . levonorgestrel-ethinyl estradiol (AVIANE,ALESSE,LESSINA) 0.1-20 MG-MCG tablet Take 1 tablet by mouth daily. Reported on 12/10/2015     No current facility-administered medications on file prior to visit.   Review of Systems  Constitutional: Negative for other unusual diaphoresis or sweats HENT:  Negative for ear discharge or swelling Eyes: Negative for other worsening visual disturbances Respiratory: Negative for stridor or other swelling  Gastrointestinal: Negative for worsening distension or other blood Genitourinary: Negative for retention or other urinary change Musculoskeletal: Negative for other MSK pain or swelling Skin: Negative for color change or other new lesions Neurological: Negative for worsening tremors and other numbness  Psychiatric/Behavioral: Negative for worsening agitation or other fatigue All otherwise neg per pt     Objective:   Physical Exam BP 120/84   Pulse 93   Temp 98 F (36.7 C) (Oral)   Ht 6' (1.829 m)   Wt 236 lb (107 kg)   LMP 08/15/2019 (Exact Date)   SpO2 98%   BMI 32.01 kg/m  VS noted,  Constitutional: Pt appears in NAD HENT: Head: NCAT.  Right Ear: External ear normal.  Left Ear: External ear normal.  Eyes: . Pupils are equal, round, and reactive to light. Conjunctivae and EOM are normal Nose: without d/c or deformity Neck: Neck supple. Gross normal ROM Cardiovascular: Normal rate and regular rhythm.   Pulmonary/Chest: Effort normal and breath sounds without rales or wheezing.  Abd:  Soft, NT, ND, + BS, no organomegaly Neurological: Pt is alert. At baseline orientation, motor grossly intact Skin: Skin is warm. No rashes, other new lesions, no LE edema Psychiatric: Pt behavior is normal without agitation  All otherwise neg per pt Lab Results  Component Value Date   WBC 6.5 08/21/2019   HGB 13.3 08/21/2019   HCT 40.2 08/21/2019   PLT 329.0 08/21/2019   GLUCOSE  85 08/21/2019   CHOL 212 (H) 08/21/2019   TRIG 80.0 08/21/2019   HDL 63.40 08/21/2019   LDLCALC 133 (H) 08/21/2019   ALT 13 08/21/2019   AST 17 08/21/2019   NA 136 08/21/2019   K 4.1 08/21/2019   CL 103 08/21/2019   CREATININE 0.69 08/21/2019   BUN 10 08/21/2019   CO2 25 08/21/2019   TSH 1.54 08/21/2019           Assessment & Plan:

## 2019-08-21 NOTE — Patient Instructions (Signed)
Please continue all other medications as before, and refills have been done if requested.  Please have the pharmacy call with any other refills you may need.  Please continue your efforts at being more active, low cholesterol diet, and weight control.  You are otherwise up to date with prevention measures today.  Please keep your appointments with your specialists as you may have planned  Please go to the LAB at the blood drawing area for the tests to be done  You will be contacted by phone if any changes need to be made immediately.  Otherwise, you will receive a letter about your results with an explanation, but please check with MyChart first.  Please remember to sign up for MyChart if you have not done so, as this will be important to you in the future with finding out test results, communicating by private email, and scheduling acute appointments online when needed.  Please return in 1 year for your yearly visit, or sooner if needed, with Lab testing done 3-5 days before

## 2019-08-30 ENCOUNTER — Encounter: Payer: Self-pay | Admitting: Internal Medicine

## 2019-08-30 NOTE — Assessment & Plan Note (Signed)
stable overall by history and exam, recent data reviewed with pt, and pt to continue medical treatment as before,  to f/u any worsening symptoms or concerns  

## 2019-10-24 ENCOUNTER — Ambulatory Visit: Payer: BC Managed Care – PPO

## 2019-11-06 ENCOUNTER — Telehealth: Payer: Self-pay

## 2019-11-06 MED ORDER — AMPHETAMINE-DEXTROAMPHET ER 20 MG PO CP24: 20.0000 mg | ORAL_CAPSULE | ORAL | 0 refills | Status: DC

## 2019-11-06 NOTE — Telephone Encounter (Signed)
1.Medication Requested:amphetamine-dextroamphetamine (ADDERALL XR) 20 MG 24 hr capsule(Expired)  2. Pharmacy (Name, Street, [place]):[facility] [phone] - , [place] - [address] &   3. On Med List: Yes   4. Last Visit with PCP: 12.23.21    5. Next visit date with PCP: no appt is made at this time     Agent: Please be advised that RX refills may take up to 3 business days. We ask that you follow-up with your pharmacy.

## 2019-11-06 NOTE — Telephone Encounter (Signed)
Done erx 

## 2020-01-07 ENCOUNTER — Other Ambulatory Visit: Payer: Self-pay | Admitting: Internal Medicine

## 2020-01-07 MED ORDER — AMPHETAMINE-DEXTROAMPHET ER 20 MG PO CP24: 20.0000 mg | ORAL_CAPSULE | ORAL | 0 refills | Status: DC

## 2020-01-07 NOTE — Telephone Encounter (Signed)
Done erx 

## 2020-04-16 ENCOUNTER — Telehealth: Payer: Self-pay

## 2020-04-16 NOTE — Telephone Encounter (Signed)
1.Medication Requested:amphetamine-dextroamphetamine (ADDERALL XR) 20 MG 24 hr capsule(Expired)  2. Pharmacy (Name, Street, [place]):[facility] [phone] - [place], [place] - [address]  3. On Med List: Yes   4. Last Visit with PCP: 12.20.20   5. Next visit date with PCP: n/a   Agent: Please be advised that RX refills may take up to 3 business days. We ask that you follow-up with your pharmacy.

## 2020-04-17 MED ORDER — AMPHETAMINE-DEXTROAMPHET ER 20 MG PO CP24: 20.0000 mg | ORAL_CAPSULE | ORAL | 0 refills | Status: DC

## 2020-04-17 NOTE — Telephone Encounter (Signed)
Done erx 

## 2020-04-17 NOTE — Telephone Encounter (Signed)
Sent to Dr. John. 

## 2020-06-18 ENCOUNTER — Telehealth: Payer: Self-pay | Admitting: Internal Medicine

## 2020-06-18 NOTE — Telephone Encounter (Signed)
    1.Medication Requested:amphetamine-dextroamphetamine (ADDERALL XR) 20 MG 24 hr capsule  2. Pharmacy (Name, Street, [place]):[facility] [phone] - [place], [place] - [address]  3. On Med List: YES  4. Last Visit with PCP: 07/2019  5. Next visit date with PCP: patient states she will call back for appointment   Agent: Please be advised that RX refills may take up to 3 business days. We ask that you follow-up with your pharmacy.

## 2020-06-22 MED ORDER — AMPHETAMINE-DEXTROAMPHET ER 20 MG PO CP24: 20.0000 mg | ORAL_CAPSULE | ORAL | 0 refills | Status: DC

## 2020-06-22 NOTE — Telephone Encounter (Signed)
Sent to Dr. John. 

## 2020-06-22 NOTE — Telephone Encounter (Signed)
Done erx 

## 2020-09-01 ENCOUNTER — Telehealth: Payer: Self-pay | Admitting: Internal Medicine

## 2020-09-01 MED ORDER — AMPHETAMINE-DEXTROAMPHET ER 20 MG PO CP24: 20.0000 mg | ORAL_CAPSULE | ORAL | 0 refills | Status: DC

## 2020-09-01 NOTE — Telephone Encounter (Signed)
Please to contact pt - due for ROV for further refills 

## 2020-09-01 NOTE — Telephone Encounter (Signed)
   1.Medication Requested: amphetamine-dextroamphetamine (ADDERALL XR) 20 MG 24 hr capsule(Expired)  2. Pharmacy (Name, Street, [place]):[facility] [phone] - [place], [place] - [address]  3. On Med List: yes  4. Last Visit with PCP: 07/2019  5. Next visit date with PCP: 09/25/20   Agent: Please be advised that RX refills may take up to 3 business days. We ask that you follow-up with your pharmacy.

## 2020-09-25 ENCOUNTER — Encounter: Payer: BC Managed Care – PPO | Admitting: Internal Medicine

## 2020-10-19 ENCOUNTER — Encounter: Payer: Self-pay | Admitting: Internal Medicine

## 2020-10-19 ENCOUNTER — Other Ambulatory Visit: Payer: Self-pay

## 2020-10-19 ENCOUNTER — Ambulatory Visit (INDEPENDENT_AMBULATORY_CARE_PROVIDER_SITE_OTHER): Payer: BC Managed Care – PPO | Admitting: Internal Medicine

## 2020-10-19 VITALS — BP 116/72 | HR 98 | Temp 97.7°F | Ht 72.0 in | Wt 227.0 lb

## 2020-10-19 DIAGNOSIS — N912 Amenorrhea, unspecified: Secondary | ICD-10-CM | POA: Insufficient documentation

## 2020-10-19 DIAGNOSIS — Z0001 Encounter for general adult medical examination with abnormal findings: Secondary | ICD-10-CM | POA: Diagnosis not present

## 2020-10-19 DIAGNOSIS — N921 Excessive and frequent menstruation with irregular cycle: Secondary | ICD-10-CM | POA: Insufficient documentation

## 2020-10-19 DIAGNOSIS — E559 Vitamin D deficiency, unspecified: Secondary | ICD-10-CM | POA: Diagnosis not present

## 2020-10-19 DIAGNOSIS — F411 Generalized anxiety disorder: Secondary | ICD-10-CM | POA: Diagnosis not present

## 2020-10-19 DIAGNOSIS — N930 Postcoital and contact bleeding: Secondary | ICD-10-CM | POA: Insufficient documentation

## 2020-10-19 DIAGNOSIS — N63 Unspecified lump in unspecified breast: Secondary | ICD-10-CM | POA: Insufficient documentation

## 2020-10-19 DIAGNOSIS — E785 Hyperlipidemia, unspecified: Secondary | ICD-10-CM

## 2020-10-19 DIAGNOSIS — N920 Excessive and frequent menstruation with regular cycle: Secondary | ICD-10-CM | POA: Insufficient documentation

## 2020-10-19 DIAGNOSIS — F988 Other specified behavioral and emotional disorders with onset usually occurring in childhood and adolescence: Secondary | ICD-10-CM | POA: Diagnosis not present

## 2020-10-19 DIAGNOSIS — R102 Pelvic and perineal pain: Secondary | ICD-10-CM | POA: Insufficient documentation

## 2020-10-19 LAB — URINALYSIS, ROUTINE W REFLEX MICROSCOPIC
Bilirubin Urine: NEGATIVE
Ketones, ur: NEGATIVE
Leukocytes,Ua: NEGATIVE
Nitrite: NEGATIVE
RBC / HPF: NONE SEEN (ref 0–?)
Specific Gravity, Urine: >=1.03 — AB (ref 1.000–1.030)
Total Protein, Urine: NEGATIVE
Urine Glucose: NEGATIVE
Urobilinogen, UA: 0.2 (ref 0.0–1.0)
pH: 5.5 (ref 5.0–8.0)

## 2020-10-19 LAB — CBC WITH DIFFERENTIAL/PLATELET
Basophils Absolute: 0.1 10*3/uL (ref 0.0–0.1)
Basophils Relative: 1.2 % (ref 0.0–3.0)
Eosinophils Absolute: 0.1 10*3/uL (ref 0.0–0.7)
Eosinophils Relative: 1.5 % (ref 0.0–5.0)
HCT: 40.6 % (ref 36.0–46.0)
Hemoglobin: 13.6 g/dL (ref 12.0–15.0)
Lymphocytes Relative: 43.1 % (ref 12.0–46.0)
Lymphs Abs: 2.8 10*3/uL (ref 0.7–4.0)
MCHC: 33.6 g/dL (ref 30.0–36.0)
MCV: 88.4 fl (ref 78.0–100.0)
Monocytes Absolute: 0.5 10*3/uL (ref 0.1–1.0)
Monocytes Relative: 8.3 % (ref 3.0–12.0)
Neutro Abs: 3 10*3/uL (ref 1.4–7.7)
Neutrophils Relative %: 45.9 % (ref 43.0–77.0)
Platelets: 296 10*3/uL (ref 150.0–400.0)
RBC: 4.59 Mil/uL (ref 3.87–5.11)
RDW: 12.7 % (ref 11.5–15.5)
WBC: 6.5 10*3/uL (ref 4.0–10.5)

## 2020-10-19 LAB — HEPATIC FUNCTION PANEL
ALT: 10 U/L (ref 0–35)
AST: 14 U/L (ref 0–37)
Albumin: 4 g/dL (ref 3.5–5.2)
Alkaline Phosphatase: 51 U/L (ref 39–117)
Bilirubin, Direct: 0.1 mg/dL (ref 0.0–0.3)
Total Bilirubin: 0.4 mg/dL (ref 0.2–1.2)
Total Protein: 6.3 g/dL (ref 6.0–8.3)

## 2020-10-19 LAB — FOLLICLE STIMULATING HORMONE: FSH: 27.6 m[IU]/mL

## 2020-10-19 LAB — VITAMIN D 25 HYDROXY (VIT D DEFICIENCY, FRACTURES): VITD: 24.61 ng/mL — ABNORMAL LOW (ref 30.00–100.00)

## 2020-10-19 LAB — LIPID PANEL
Cholesterol: 172 mg/dL (ref 0–200)
HDL: 53.4 mg/dL (ref >39.00)
LDL Cholesterol: 87 mg/dL (ref 0–99)
NonHDL: 118.83
Total CHOL/HDL Ratio: 3
Triglycerides: 161 mg/dL — ABNORMAL HIGH (ref 0.0–149.0)
VLDL: 32.2 mg/dL (ref 0.0–40.0)

## 2020-10-19 LAB — BASIC METABOLIC PANEL
BUN: 8 mg/dL (ref 6–23)
CO2: 29 mEq/L (ref 19–32)
Calcium: 9.4 mg/dL (ref 8.4–10.5)
Chloride: 103 mEq/L (ref 96–112)
Creatinine, Ser: 0.72 mg/dL (ref 0.40–1.20)
GFR: 104.83 mL/min (ref >60.00)
Glucose, Bld: 71 mg/dL (ref 70–99)
Potassium: 4.7 mEq/L (ref 3.5–5.1)
Sodium: 138 mEq/L (ref 135–145)

## 2020-10-19 LAB — TSH: TSH: 1.64 u[IU]/mL (ref 0.35–4.50)

## 2020-10-19 LAB — HCG, QUANTITATIVE, PREGNANCY: Quantitative HCG: 0.63 m[IU]/mL

## 2020-10-19 MED ORDER — AMPHETAMINE-DEXTROAMPHET ER 20 MG PO CP24: 20.0000 mg | ORAL_CAPSULE | ORAL | 0 refills | Status: DC

## 2020-10-19 NOTE — Patient Instructions (Signed)

## 2020-10-19 NOTE — Assessment & Plan Note (Signed)
Stable, declines med change for now

## 2020-10-19 NOTE — Assessment & Plan Note (Signed)
Also for bHCG testing, and FSH, f/u with GYN, consider endo referral

## 2020-10-19 NOTE — Progress Notes (Signed)
Patient ID: Alexis Hoover, female   DOB: 10-28-1980, 40 y.o.   MRN: [id number]         Chief Complaint:: wellness exam and amenorrhea with siter hx of premature ovary failure       HPI:  Alexis Hoover is a 40 y.o. female here for wellness exam; up to date with immunizations and preventive referrals  Saw GYN late last yr.Marland Kitchen LMP approx 6 mo, not pregnant after home testing last wk.  Sister with hx of premature ovary failure - no menses ever, though IVF worked for [name].  Has not taken BPC for 2 mo.  ADD med working well with good concentration and completion of tasks at work and home       Wt Readings from Last 3 Encounters:  10/19/20 227 lb (103 kg)  08/21/19 236 lb (107 kg)  08/21/18 230 lb (104.3 kg)   BP Readings from Last 3 Encounters:  10/19/20 116/72  08/21/19 120/84  08/21/18 116/72   Immunization History  Administered Date(s) Administered  . Influenza, Seasonal, Injecte, Preservative Fre 06/29/2013  . Influenza-Unspecified 07/05/2018  . PFIZER Comirnaty(Gray Top)Covid-19 Tri-Sucrose Vaccine 10/29/2019, 11/02/2019  . Tdap 04/16/2011  . Unspecified SARS-COV-2 Vaccination 10/28/2018  There are no preventive care reminders to display for this patient.    Past Medical History:  Diagnosis Date  . Abnormal Pap smear    LEEP  . Lumbar disc disease 04/19/2017  . No pertinent past medical history    Past Surgical History:  Procedure Laterality Date  . LEEP      reports that she has never smoked. She has never used smokeless tobacco. She reports current alcohol use. She reports that she does not use drugs. family history includes Asthma in her sister; Cancer in her paternal grandfather; Diabetes in her maternal grandmother; Hypertension in her mother; Stroke in her maternal grandmother; Vision loss in her maternal grandmother. No Known Allergies Current Outpatient Medications on File Prior to Visit  Medication Sig Dispense Refill  . MILI 0.25-35 MG-MCG tablet Take 1  tablet by mouth daily.    . norgestimate-ethinyl estradiol (ORTHO-CYCLEN) 0.25-35 MG-MCG tablet 0.25 mg.    . levonorgestrel-ethinyl estradiol (AVIANE,ALESSE,LESSINA) 0.1-20 MG-MCG tablet Take 1 tablet by mouth daily. Reported on 12/10/2015 (Patient not taking: Reported on 10/19/2020)     No current facility-administered medications on file prior to visit.        ROS:  All others reviewed and negative.  Objective        PE:  BP 116/72   Pulse 98   Temp 97.7 F (36.5 C) (Oral)   Ht 6' (1.829 m)   Wt 227 lb (103 kg)   SpO2 98%   BMI 30.79 kg/m                 Constitutional: Pt appears in NAD               HENT: Head: NCAT.                Right Ear: External ear normal.                 Left Ear: External ear normal.                Eyes: . Pupils are equal, round, and reactive to light. Conjunctivae and EOM are normal               Nose: without d/c or deformity  Neck: Neck supple. Gross normal ROM               Cardiovascular: Normal rate and regular rhythm.                 Pulmonary/Chest: Effort normal and breath sounds without rales or wheezing.                Abd:  Soft, NT, ND, + BS, no organomegaly               Neurological: Pt is alert. At baseline orientation, motor grossly intact               Skin: Skin is warm. No rashes, no other new lesions, LE edema - none               Psychiatric: Pt behavior is normal without agitation   Micro: none  Cardiac tracings I have personally interpreted today:  none  Pertinent Radiological findings (summarize): none   Lab Results  Component Value Date   WBC 6.5 10/19/2020   HGB 13.6 10/19/2020   HCT 40.6 10/19/2020   PLT 296.0 10/19/2020   GLUCOSE 71 10/19/2020   CHOL 172 10/19/2020   TRIG 161.0 (H) 10/19/2020   HDL 53.40 10/19/2020   LDLCALC 87 10/19/2020   ALT 10 10/19/2020   AST 14 10/19/2020   NA 138 10/19/2020   K 4.7 10/19/2020   CL 103 10/19/2020   CREATININE 0.72 10/19/2020   BUN 8 10/19/2020   CO2  29 10/19/2020   TSH 1.64 10/19/2020   Assessment/Plan:  Alexis Hoover is a 40 y.o. White or Caucasian [1] female with  has a past medical history of Abnormal Pap smear, Lumbar disc disease (04/19/2017), and No pertinent past medical history.  Encounter for well adult exam with abnormal findings Age and sex appropriate education and counseling updated with regular exercise and diet Referrals for preventative services - none needed Immunizations addressed - none needed Smoking counseling  - none needed Evidence for depression or other mood disorder - none significant Most recent labs reviewed. I have personally reviewed and have noted: 1) the patient's medical and social history 2) The patient's current medications and supplements 3) The patient's height, weight, and BMI have been recorded in the chart   Amenorrhea Also for bHCG testing, and FSH, f/u with GYN, consider endo referral  Anxiety state Stable, declines med change for now   Attention deficit disorder Stable, cont adderall no change,  to f/u any worsening symptoms or concerns  HLD (hyperlipidemia) Mild, for low chol diet,  to f/u any worsening symptoms or concerns, declines statin  Followup: Return in about 1 year (around 10/19/2021).  Alexis Barre, MD 10/19/2020 9:10 PM [facility] - [facility]

## 2020-10-19 NOTE — Assessment & Plan Note (Signed)
Stable, cont adderall no change,  to f/u any worsening symptoms or concerns

## 2020-10-19 NOTE — Assessment & Plan Note (Signed)
Mild, for low chol diet,  to f/u any worsening symptoms or concerns, declines statin

## 2020-10-19 NOTE — Assessment & Plan Note (Signed)

## 2020-12-23 ENCOUNTER — Telehealth: Payer: Self-pay | Admitting: Internal Medicine

## 2020-12-23 MED ORDER — AMPHETAMINE-DEXTROAMPHET ER 20 MG PO CP24: 20.0000 mg | ORAL_CAPSULE | ORAL | 0 refills | Status: DC

## 2020-12-23 NOTE — Telephone Encounter (Signed)
1.Medication Requested: amphetamine-dextroamphetamine (ADDERALL XR) 20 MG 24 hr capsule    2. Pharmacy (Name, Street, [place]): [facility] [phone] - [place], [place] - [address] AT [address]  3. On Med List: yes   4. Last Visit with PCP: 10-19-20  5. Next visit date with PCP: n/a    Agent: Please be advised that RX refills may take up to 3 business days. We ask that you follow-up with your pharmacy.

## 2020-12-23 NOTE — Telephone Encounter (Signed)
PMP done; last filled 10/26/20

## 2021-01-04 NOTE — Telephone Encounter (Signed)
[facility] [address] [place]), [place] - [id number] Phone:  [phone]  Fax:  [phone]     Patient needs this medication to go to the pharmacy listed about because her pharmacy cannot get it in stock until the end of this month

## 2021-01-07 MED ORDER — AMPHETAMINE-DEXTROAMPHET ER 20 MG PO CP24: 20.0000 mg | ORAL_CAPSULE | ORAL | 0 refills | Status: DC

## 2021-01-07 NOTE — Telephone Encounter (Signed)
Ok done

## 2021-01-07 NOTE — Addendum Note (Signed)
Addended by: Corwin Levins on: 01/07/2021 09:26 AM   Modules accepted: Orders

## 2021-02-15 ENCOUNTER — Telehealth: Payer: Self-pay | Admitting: Internal Medicine

## 2021-02-15 NOTE — Telephone Encounter (Signed)
1.Medication Requested: amphetamine-dextroamphetamine (ADDERALL XR) 20 MG 24 hr capsule   2. Pharmacy (Name, Street, [place]): [id number] - [place] ([place]), [place] - [address]  3. On Med List: yes   4. Last Visit with PCP: 10-19-20  5. Next visit date with PCP: n/a    Agent: Please be advised that RX refills may take up to 3 business days. We ask that you follow-up with your pharmacy.

## 2021-02-16 MED ORDER — AMPHETAMINE-DEXTROAMPHET ER 20 MG PO CP24: 20.0000 mg | ORAL_CAPSULE | ORAL | 0 refills | Status: DC

## 2021-03-19 ENCOUNTER — Telehealth: Payer: Self-pay

## 2021-03-19 NOTE — Telephone Encounter (Signed)
pt is asking for a med refill of her amphetamine-dextroamphetamine (ADDERALL XR) 20 MG 24 hr capsule.  **Sent to [facility]**

## 2021-03-23 MED ORDER — AMPHETAMINE-DEXTROAMPHET ER 20 MG PO CP24: 20.0000 mg | ORAL_CAPSULE | ORAL | 0 refills | Status: DC

## 2021-03-23 NOTE — Addendum Note (Signed)
Addended by: Corwin Levins on: 03/23/2021 11:16 AM   Modules accepted: Orders

## 2021-03-30 ENCOUNTER — Telehealth: Payer: Self-pay | Admitting: Internal Medicine

## 2021-03-30 NOTE — Telephone Encounter (Signed)
Too soon adderall  Already done July 26

## 2021-03-30 NOTE — Telephone Encounter (Signed)
Patient needs new rx for:   amphetamine-dextroamphetamine (ADDERALL XR) 20 MG 24 hr capsule  Sent to pharmacy: [id number] - [place], [place] - [address] AT [facility] [address]  Phone:  [phone] Fax:  [phone]   Prev rx [facility] to [facility] [address] & they are temp closed & don't know when they will be reopening  Patient has 2 pills left

## 2021-03-30 NOTE — Telephone Encounter (Signed)
Last RF per controlled substance database: 02/16/21 Last OV: 10/19/20 Next OV: none scheduled

## 2021-03-31 MED ORDER — AMPHETAMINE-DEXTROAMPHET ER 20 MG PO CP24: 20.0000 mg | ORAL_CAPSULE | ORAL | 0 refills | Status: DC

## 2021-03-31 NOTE — Addendum Note (Signed)
Addended by: Corwin Levins on: 03/31/2021 02:17 PM   Modules accepted: Orders

## 2021-05-12 ENCOUNTER — Telehealth: Payer: Self-pay | Admitting: Internal Medicine

## 2021-05-12 NOTE — Telephone Encounter (Signed)
1.Medication Requested: amphetamine-dextroamphetamine (ADDERALL XR) 20 MG 24 hr capsule  2. Pharmacy (Name, Street, [facility]): [facility] #[id number] - [place], [place] - [address] AT [facility] [address]  Phone:  [phone] Fax:  [phone]   3. On Med List: yes  4. Last Visit with PCP: 02.21.22  5. Next visit date with PCP: n/a   Agent: Please be advised that RX refills may take up to 3 business days. We ask that you follow-up with your pharmacy.

## 2021-05-13 MED ORDER — AMPHETAMINE-DEXTROAMPHET ER 20 MG PO CP24: 20.0000 mg | ORAL_CAPSULE | ORAL | 0 refills | Status: DC

## 2021-06-24 ENCOUNTER — Telehealth: Payer: Self-pay | Admitting: Internal Medicine

## 2021-06-24 MED ORDER — AMPHETAMINE-DEXTROAMPHET ER 20 MG PO CP24: 20.0000 mg | ORAL_CAPSULE | ORAL | 0 refills | Status: DC

## 2021-06-24 NOTE — Telephone Encounter (Signed)
1.Medication Requested: amphetamine-dextroamphetamine (ADDERALL XR) 20 MG 24 hr capsule     2. Pharmacy (Name, Street, [facility]): [facility] #[id number] - [place], [place] - [address] AT [facility] [address] & [address]              Phone:      [phone]  Fax:           [phone]        3. On Med List: yes     4. Last Visit with PCP: 02.21.22     5. Next visit date with PCP: n/a

## 2021-08-05 ENCOUNTER — Telehealth (INDEPENDENT_AMBULATORY_CARE_PROVIDER_SITE_OTHER): Payer: BC Managed Care – PPO | Admitting: Family Medicine

## 2021-08-05 DIAGNOSIS — U071 COVID-19: Secondary | ICD-10-CM

## 2021-08-05 MED ORDER — PROMETHAZINE-DM 6.25-15 MG/5ML PO SYRP: 5.0000 mL | ORAL_SOLUTION | Freq: Four times a day (QID) | ORAL | 0 refills | Status: DC | PRN

## 2021-08-05 NOTE — Patient Instructions (Addendum)
   ---------------------------------------------------------------------------------------------------------------------------      WORK SLIP:  Patient Alexis Hoover,  [birth year], was seen for a medical visit today, 08/05/21 . Please excuse from work for a COVID/flu like illness. If Covid19 testing is positive advise 10 days minimum from the onset of symptoms (07/31/21) PLUS 1 day of no fever and improved symptoms. Will defer to employer for a sooner return to work if patient if symptoms have resolved, it is greater than 5 days since the positive test and the patient can wear a high-quality, tight fitting mask such as N95 or KN95 at all times for an additional 5 days. [id number] is usually contagious for about 7-10 days.   Sincerely: E-signature: Dr. Kriste Basque, DO [facility] [place] Ph: [phone]   ------------------------------------------------------------------------------------------------------------------------------    HOME CARE TIPS:   -I sent the medication(s) we discussed to your pharmacy: Meds ordered this encounter  Medications   promethazine-dextromethorphan (PROMETHAZINE-DM) 6.25-15 MG/5ML syrup    Sig: Take 5 mLs by mouth 4 (four) times daily as needed for cough.    Dispense:  118 mL    Refill:  0    -can use tylenol or aleve if needed for fevers, aches and pains per instructions  -can use nasal saline a few times per day if you have nasal congestion; sometimes  a short course of Afrin nasal spray for 3 days can help with symptoms as well  -stay hydrated, drink plenty of fluids and eat small healthy meals - avoid dairy  -can take 1000 IU ( ) Vit D3 and 100-500 mg of Vit C daily per instructions  -follow up with your doctor in 2-3 days unless improving and feeling better  -stay home while sick, except to seek medical care. If you have COVID19, you will likely be contagious for 7-10 days. Flu or Influenza is likely contagious for about 7  days. Other respiratory viral infections remain contagious for 5-10+ days depending on the virus and many other factors. Wear a good mask that fits snugly (such as N95 or KN95) if around others to reduce the risk of transmission.  It was nice to meet you today, and I really hope you are feeling better soon. I help [facility] out with telemedicine visits on Tuesdays and Thursdays and am happy to help if you need a follow up virtual visit on those days. Otherwise, if you have any concerns or questions following this visit please schedule a follow up visit with your Primary Care doctor or seek care at a local urgent care clinic to avoid delays in care.    Seek in person care or schedule a follow up video visit promptly if your symptoms worsen, new concerns arise or you are not improving with treatment. Call 911 and/or seek emergency care if your symptoms are severe or life threatening.

## 2021-08-05 NOTE — Progress Notes (Signed)
Virtual Visit via Video Note  I connected with Alexis Hoover  on 08/05/21 at  6:20 PM EST by a video enabled telemedicine application and verified that I am speaking with the correct person using two identifiers.  Location patient: home, [place] Location provider:work or home office Persons participating in the virtual visit: patient, provider  I discussed the limitations of evaluation and management by telemedicine and the availability of in person appointments. The patient expressed understanding and agreed to proceed.   HPI:  Acute telemedicine visit for Covid19: -Onset:about 5-6 days ago -Symptoms include: scratchy throat, sore throat, body aches, headache, ear discomfort, cough -Denies: CP, SOB, NVD, inability to eat/drink/get out of bed -Pertinent past medical history: see below -Pertinent medication allergies: No Known Allergies -denies any chance of pregnancy -COVID-19 vaccine status:  Immunization History  Administered Date(s) Administered   Influenza, Seasonal, Injecte, Preservative Fre 06/29/2013   Influenza-Unspecified 07/05/2018   PFIZER Comirnaty(Gray Top)Covid-19 Tri-Sucrose Vaccine 10/29/2019, 11/02/2019   Tdap 04/16/2011   Unspecified SARS-COV-2 Vaccination 10/28/2018    ROS: See pertinent positives and negatives per HPI.  Past Medical History:  Diagnosis Date   Abnormal Pap smear    LEEP   Lumbar disc disease 04/19/2017   No pertinent past medical history     Past Surgical History:  Procedure Laterality Date   LEEP       Current Outpatient Medications:    promethazine-dextromethorphan (PROMETHAZINE-DM) 6.25-15 MG/5ML syrup, Take 5 mLs by mouth 4 (four) times daily as needed for cough., Disp: 118 mL, Rfl: 0   amphetamine-dextroamphetamine (ADDERALL XR) 20 MG 24 hr capsule, Take 1 capsule (20 mg total) by mouth every morning., Disp: 30 capsule, Rfl: 0   levonorgestrel-ethinyl estradiol (AVIANE,ALESSE,LESSINA) 0.1-20 MG-MCG tablet, Take 1 tablet by mouth daily.  Reported on 12/10/2015 (Patient not taking: Reported on 10/19/2020), Disp: , Rfl:    MILI 0.25-35 MG-MCG tablet, Take 1 tablet by mouth daily., Disp: , Rfl:    norgestimate-ethinyl estradiol (ORTHO-CYCLEN) 0.25-35 MG-MCG tablet, 0.25 mg., Disp: , Rfl:   EXAM:  VITALS per patient if applicable:  GENERAL: alert, oriented, appears well and in no acute distress  HEENT: atraumatic, conjunttiva clear, no obvious abnormalities on inspection of external nose and ears  NECK: normal movements of the head and neck  LUNGS: on inspection no signs of respiratory distress, breathing rate appears normal, no obvious gross SOB, gasping or wheezing  CV: no obvious cyanosis  MS: moves all visible extremities without noticeable abnormality  PSYCH/NEURO: pleasant and cooperative, no obvious depression or anxiety, speech and thought processing grossly intact  ASSESSMENT AND PLAN:  Discussed the following assessment and plan:  COVID-19  -we discussed typical course, treatment options, treatment window, potential complications, isolation - contagious period and symptomatic care. She opted for Rx for cough and symptomatic care measures per patient instructions.  Work/School slipped offered: provided in patient instructions   Advised to vv or seek prompt in person care if worsening, new symptoms arise, or if is not improving with treatment. Did let this patient know that I  do telemedicine on Tuesdays and Thursdays for [facility].  I discussed the assessment and treatment plan with the patient. The patient was provided an opportunity to ask questions and all were answered. The patient agreed with the plan and demonstrated an understanding of the instructions.     Terressa Koyanagi, DO

## 2021-08-12 ENCOUNTER — Other Ambulatory Visit: Payer: Self-pay | Admitting: Internal Medicine

## 2021-08-12 MED ORDER — AMPHETAMINE-DEXTROAMPHET ER 20 MG PO CP24: 20.0000 mg | ORAL_CAPSULE | ORAL | 0 refills | Status: DC

## 2021-10-01 ENCOUNTER — Telehealth: Payer: Self-pay | Admitting: Internal Medicine

## 2021-10-01 MED ORDER — AMPHETAMINE-DEXTROAMPHET ER 20 MG PO CP24: 20.0000 mg | ORAL_CAPSULE | ORAL | 0 refills | Status: DC

## 2021-10-01 NOTE — Telephone Encounter (Signed)
1.Medication Requested: amphetamine-dextroamphetamine (ADDERALL XR) 20 MG 24 hr capsule (Expired)  2. Pharmacy (Name, Street, [place]): [facility] [phone] - [place], [place] - [address]  3. On Med List: yes  4. Last Visit with PCP: 10-19-2020  5. Next visit date with PCP: 11-26-2021   Agent: Please be advised that RX refills may take up to 3 business days. We ask that you follow-up with your pharmacy.

## 2021-11-25 ENCOUNTER — Encounter: Payer: Self-pay | Admitting: Internal Medicine

## 2021-11-26 ENCOUNTER — Encounter: Payer: BC Managed Care – PPO | Admitting: Internal Medicine

## 2021-12-09 ENCOUNTER — Ambulatory Visit (INDEPENDENT_AMBULATORY_CARE_PROVIDER_SITE_OTHER): Payer: BC Managed Care – PPO | Admitting: Internal Medicine

## 2021-12-09 ENCOUNTER — Encounter: Payer: Self-pay | Admitting: Internal Medicine

## 2021-12-09 VITALS — BP 102/66 | HR 77 | Temp 97.4°F | Ht 72.0 in | Wt 233.0 lb

## 2021-12-09 DIAGNOSIS — R739 Hyperglycemia, unspecified: Secondary | ICD-10-CM

## 2021-12-09 DIAGNOSIS — F411 Generalized anxiety disorder: Secondary | ICD-10-CM

## 2021-12-09 DIAGNOSIS — E785 Hyperlipidemia, unspecified: Secondary | ICD-10-CM | POA: Diagnosis not present

## 2021-12-09 DIAGNOSIS — E559 Vitamin D deficiency, unspecified: Secondary | ICD-10-CM | POA: Diagnosis not present

## 2021-12-09 DIAGNOSIS — Z0001 Encounter for general adult medical examination with abnormal findings: Secondary | ICD-10-CM | POA: Diagnosis not present

## 2021-12-09 DIAGNOSIS — E6609 Other obesity due to excess calories: Secondary | ICD-10-CM | POA: Diagnosis not present

## 2021-12-09 DIAGNOSIS — Z6830 Body mass index (BMI) 30.0-30.9, adult: Secondary | ICD-10-CM

## 2021-12-09 DIAGNOSIS — F988 Other specified behavioral and emotional disorders with onset usually occurring in childhood and adolescence: Secondary | ICD-10-CM

## 2021-12-09 LAB — LIPID PANEL
Cholesterol: 218 mg/dL — ABNORMAL HIGH (ref 0–200)
HDL: 73.4 mg/dL (ref >39.00)
LDL Cholesterol: 125 mg/dL — ABNORMAL HIGH (ref 0–99)
NonHDL: 144.66
Total CHOL/HDL Ratio: 3
Triglycerides: 99 mg/dL (ref 0.0–149.0)
VLDL: 19.8 mg/dL (ref 0.0–40.0)

## 2021-12-09 LAB — URINALYSIS, ROUTINE W REFLEX MICROSCOPIC
Bilirubin Urine: NEGATIVE
Ketones, ur: NEGATIVE
Leukocytes,Ua: NEGATIVE
Nitrite: NEGATIVE
Specific Gravity, Urine: 1.02 (ref 1.000–1.030)
Total Protein, Urine: NEGATIVE
Urine Glucose: NEGATIVE
Urobilinogen, UA: 0.2 (ref 0.0–1.0)
pH: 7 (ref 5.0–8.0)

## 2021-12-09 LAB — BASIC METABOLIC PANEL
BUN: 11 mg/dL (ref 6–23)
CO2: 27 mEq/L (ref 19–32)
Calcium: 9.5 mg/dL (ref 8.4–10.5)
Chloride: 102 mEq/L (ref 96–112)
Creatinine, Ser: 0.76 mg/dL (ref 0.40–1.20)
GFR: 97.46 mL/min (ref >60.00)
Glucose, Bld: 76 mg/dL (ref 70–99)
Potassium: 4.1 mEq/L (ref 3.5–5.1)
Sodium: 136 mEq/L (ref 135–145)

## 2021-12-09 LAB — CBC WITH DIFFERENTIAL/PLATELET
Basophils Absolute: 0.1 10*3/uL (ref 0.0–0.1)
Basophils Relative: 1.8 % (ref 0.0–3.0)
Eosinophils Absolute: 0.1 10*3/uL (ref 0.0–0.7)
Eosinophils Relative: 0.9 % (ref 0.0–5.0)
HCT: 41.4 % (ref 36.0–46.0)
Hemoglobin: 13.7 g/dL (ref 12.0–15.0)
Lymphocytes Relative: 46.3 % — ABNORMAL HIGH (ref 12.0–46.0)
Lymphs Abs: 2.7 10*3/uL (ref 0.7–4.0)
MCHC: 33.1 g/dL (ref 30.0–36.0)
MCV: 89.8 fl (ref 78.0–100.0)
Monocytes Absolute: 0.7 10*3/uL (ref 0.1–1.0)
Monocytes Relative: 11.2 % (ref 3.0–12.0)
Neutro Abs: 2.3 10*3/uL (ref 1.4–7.7)
Neutrophils Relative %: 39.8 % — ABNORMAL LOW (ref 43.0–77.0)
Platelets: 342 10*3/uL (ref 150.0–400.0)
RBC: 4.61 Mil/uL (ref 3.87–5.11)
RDW: 12.3 % (ref 11.5–15.5)
WBC: 5.8 10*3/uL (ref 4.0–10.5)

## 2021-12-09 LAB — HEPATIC FUNCTION PANEL
ALT: 15 U/L (ref 0–35)
AST: 21 U/L (ref 0–37)
Albumin: 4.4 g/dL (ref 3.5–5.2)
Alkaline Phosphatase: 56 U/L (ref 39–117)
Bilirubin, Direct: 0.1 mg/dL (ref 0.0–0.3)
Total Bilirubin: 0.6 mg/dL (ref 0.2–1.2)
Total Protein: 7.2 g/dL (ref 6.0–8.3)

## 2021-12-09 LAB — TSH: TSH: 1.56 u[IU]/mL (ref 0.35–5.50)

## 2021-12-09 LAB — VITAMIN D 25 HYDROXY (VIT D DEFICIENCY, FRACTURES): VITD: 28.92 ng/mL — ABNORMAL LOW (ref 30.00–100.00)

## 2021-12-09 LAB — HEMOGLOBIN A1C: Hgb A1c MFr Bld: 5.6 % (ref 4.6–6.5)

## 2021-12-09 MED ORDER — AMPHETAMINE-DEXTROAMPHET ER 20 MG PO CP24: 20.0000 mg | ORAL_CAPSULE | ORAL | 0 refills | Status: DC

## 2021-12-09 NOTE — Patient Instructions (Signed)
Please take OTC Vitamin D3 at 2000 units per day, indefinitely ? ?Please continue all other medications as before, and refills have been done if requested - adderall ? ?Please have the pharmacy call with any other refills you may need. ? ?Please continue your efforts at being more active, low cholesterol diet, and weight control. ? ?You are otherwise up to date with prevention measures today. ? ?Please keep your appointments with your specialists as you may have planned ? ?Please go to the LAB at the blood drawing area for the tests to be done ? ?You will be contacted by phone if any changes need to be made immediately.  Otherwise, you will receive a letter about your results with an explanation, but please check with MyChart first. ? ?Please remember to sign up for MyChart if you have not done so, as this will be important to you in the future with finding out test results, communicating by private email, and scheduling acute appointments online when needed. ? ?Please make an Appointment to return for your 1 year visit, or sooner if needed ?

## 2021-12-09 NOTE — Progress Notes (Signed)
Patient ID: Alexis Hoover, female   DOB: [birth year], 41 y.o.   MRN: [id number] ? ? ? ?     Chief Complaint:: wellness exam and low vit d, obesity, anxiety ADD ? ?     HPI:  Alexis Hoover is a 41 y.o. female here for wellness exam; declines tdap, o/w up to date. ?         ?              Also Pt denies chest pain, increased sob or doe, wheezing, orthopnea, PND, increased LE swelling, palpitations, dizziness or syncope.   Pt denies polydipsia, polyuria, or new focal neuro s/s.    Pt denies fever, wt loss, night sweats, loss of appetite, or other constitutional symptoms  Denies worsening depressive symptoms, suicidal ideation, or panic; has ongoing anxiety, ADD med working well with good focus and work and family concentration.  Not taking Vit D.  Cannot lose wt despite recent diet and exercise.  No other new complaints ?  ?Wt Readings from Last 3 Encounters:  ?12/09/21 233 lb (105.7 kg)  ?10/19/20 227 lb (103 kg)  ?08/21/19 236 lb (107 kg)  ? ?BP Readings from Last 3 Encounters:  ?12/09/21 102/66  ?10/19/20 116/72  ?08/21/19 120/84  ? ?Immunization History  ?Administered Date(s) Administered  ? Influenza, Seasonal, Injecte, Preservative Fre 06/29/2013  ? Influenza-Unspecified 07/05/2018  ? PFIZER Comirnaty(Gray Top)Covid-19 Tri-Sucrose Vaccine 10/29/2019, 11/02/2019  ? Tdap 04/16/2011  ? Unspecified SARS-COV-2 Vaccination 10/28/2018  ? ?There are no preventive care reminders to display for this patient. ? ?  ? ?Past Medical History:  ?Diagnosis Date  ? Abnormal Pap smear   ? LEEP  ? Lumbar disc disease 04/19/2017  ? No pertinent past medical history   ? ?Past Surgical History:  ?Procedure Laterality Date  ? LEEP    ? ? reports that she has never smoked. She has never used smokeless tobacco. She reports current alcohol use. She reports that she does not use drugs. ?family history includes Asthma in her sister; Cancer in her paternal grandfather; Diabetes in her maternal grandmother; Hypertension in her mother;  Stroke in her maternal grandmother; Vision loss in her maternal grandmother. ?No Known Allergies ?Current Outpatient Medications on File Prior to Visit  ?Medication Sig Dispense Refill  ? norgestimate-ethinyl estradiol (ORTHO-CYCLEN) 0.25-35 MG-MCG tablet 0.25 mg.    ? ?No current facility-administered medications on file prior to visit.  ? ?     ROS:  All others reviewed and negative. ? ?Objective  ? ?     PE:  BP 102/66 (BP Location: Right Arm, Patient Position: Sitting, Cuff Size: Large)   Pulse 77   Temp (!) 97.4 ?F (36.3 ?C) (Oral)   Ht 6' (1.829 m)   Wt 233 lb (105.7 kg)   SpO2 96%   BMI 31.60 kg/m?  ? ?              Constitutional: Pt appears in NAD ?              HENT: Head: NCAT.  ?              Right Ear: External ear normal.   ?              Left Ear: External ear normal.  ?              Eyes: . Pupils are equal, round, and reactive to light. Conjunctivae and EOM are normal ?  Nose: without d/c or deformity ?              Neck: Neck supple. Gross normal ROM ?              Cardiovascular: Normal rate and regular rhythm.   ?              Pulmonary/Chest: Effort normal and breath sounds without rales or wheezing.  ?              Abd:  Soft, NT, ND, + BS, no organomegaly ?              Neurological: Pt is alert. At baseline orientation, motor grossly intact ?              Skin: Skin is warm. No rashes, no other new lesions, LE edema - none ?              Psychiatric: Pt behavior is normal without agitation  ? ?Micro: none ? ?Cardiac tracings I have personally interpreted today:  none ? ?Pertinent Radiological findings (summarize): none  ? ?Lab Results  ?Component Value Date  ? WBC 5.8 12/09/2021  ? HGB 13.7 12/09/2021  ? HCT 41.4 12/09/2021  ? PLT 342.0 12/09/2021  ? GLUCOSE 76 12/09/2021  ? CHOL 218 (H) 12/09/2021  ? TRIG 99.0 12/09/2021  ? HDL 73.40 12/09/2021  ? LDLCALC 125 (H) 12/09/2021  ? ALT 15 12/09/2021  ? AST 21 12/09/2021  ? NA 136 12/09/2021  ? K 4.1 12/09/2021  ? CL 102  12/09/2021  ? CREATININE 0.76 12/09/2021  ? BUN 11 12/09/2021  ? CO2 27 12/09/2021  ? TSH 1.56 12/09/2021  ? HGBA1C 5.6 12/09/2021  ? ?Assessment/Plan:  ?Alexis Hoover is a 41 y.o. White or Caucasian [1] female with  has a past medical history of Abnormal Pap smear, Lumbar disc disease (04/19/2017), and No pertinent past medical history. ? ?Encounter for well adult exam with abnormal findings ?Age and sex appropriate education and counseling updated with regular exercise and diet ?Referrals for preventative services - none needed ?Immunizations addressed - declines tdap ?Smoking counseling  - none needed ?Evidence for depression or other mood disorder - none significant ?Most recent labs reviewed. ?I have personally reviewed and have noted: ?1) the patient's medical and social history ?2) The patient's current medications and supplements ?3) The patient's height, weight, and BMI have been recorded in the chart ? ? ?Anxiety state ?Mild chronic, declines need for further eval or tx for now ? ?Attention deficit disorder ?Stable, cont adderall asd,  to f/u any worsening symptoms or concerns ? ?HLD (hyperlipidemia) ?Lab Results  ?Component Value Date  ? LDLCALC 125 (H) 12/09/2021  ? ?Mild uncontrolled, pt to continue current low chol diet, declines statin for now ? ? ?Obesity ?Persistent uncontrolled, d/w pt- to consider wegovy ? ?Vitamin D deficiency ?Last vitamin D ?Lab Results  ?Component Value Date  ? VD25OH 28.92 (L) 12/09/2021  ? ?Low, to start oral replacement ? ?Followup: Return in about 1 year (around 12/10/2022). ? ?[name], MD 12/14/2021 9:54 PM ?[facility] ?[facility] ?Internal Medicine ?

## 2021-12-14 ENCOUNTER — Encounter: Payer: Self-pay | Admitting: Internal Medicine

## 2021-12-14 NOTE — Assessment & Plan Note (Signed)
Age and sex appropriate education and counseling updated with regular exercise and diet Referrals for preventative services - none needed Immunizations addressed - declines tdap Smoking counseling  - none needed Evidence for depression or other mood disorder - none significant Most recent labs reviewed. I have personally reviewed and have noted: 1) the patient's medical and social history 2) The patient's current medications and supplements 3) The patient's height, weight, and BMI have been recorded in the chart  

## 2021-12-14 NOTE — Assessment & Plan Note (Signed)
Mild chronic, declines need for further eval or tx for now ?

## 2021-12-14 NOTE — Assessment & Plan Note (Signed)
Last vitamin D ?Lab Results  ?Component Value Date  ? VD25OH 28.92 (L) 12/09/2021  ? ?Low, to start oral replacement ?

## 2021-12-14 NOTE — Assessment & Plan Note (Signed)
Stable, cont adderall asd,  to f/u any worsening symptoms or concerns 

## 2021-12-14 NOTE — Assessment & Plan Note (Signed)
Persistent uncontrolled, d/w pt- to consider wegovy ?

## 2021-12-14 NOTE — Assessment & Plan Note (Signed)
Lab Results  ?Component Value Date  ? LDLCALC 125 (H) 12/09/2021  ? ?Mild uncontrolled, pt to continue current low chol diet, declines statin for now ? ?

## 2022-02-03 ENCOUNTER — Telehealth: Payer: Self-pay | Admitting: Internal Medicine

## 2022-02-03 MED ORDER — AMPHETAMINE-DEXTROAMPHET ER 20 MG PO CP24: 20.0000 mg | ORAL_CAPSULE | ORAL | 0 refills | Status: DC

## 2022-02-03 NOTE — Telephone Encounter (Signed)
Patient would like refills on her adderall xr 20 mg. - please send to [facility] on [address], [place], [place]

## 2022-02-09 ENCOUNTER — Telehealth: Payer: Self-pay | Admitting: Internal Medicine

## 2022-02-09 MED ORDER — AMPHETAMINE-DEXTROAMPHET ER 20 MG PO CP24: 20.0000 mg | ORAL_CAPSULE | ORAL | 0 refills | Status: DC

## 2022-02-09 NOTE — Telephone Encounter (Signed)
Pt called and stated [facility] does not have  amphetamine-dextroamphetamine (ADDERALL XR) 20 MG 24 hr capsule.  Requesting medication to be sent to  [facility] [id number] [place], [place] - [address] Phone:  [phone]  Fax:  [phone]

## 2022-02-09 NOTE — Telephone Encounter (Signed)
Ok done

## 2022-03-18 ENCOUNTER — Telehealth: Payer: Self-pay | Admitting: Internal Medicine

## 2022-03-18 MED ORDER — AMPHETAMINE-DEXTROAMPHET ER 20 MG PO CP24: 20.0000 mg | ORAL_CAPSULE | ORAL | 0 refills | Status: DC

## 2022-03-18 NOTE — Telephone Encounter (Signed)
Caller & Relationship to patient:self   Call back number:[phone]   Date of last office visit:12/09/21   Date of next office visit:   Medication(s) to be refilled:amphetamine-dextroamphetamine (ADDERALL XR) 20 MG          Preferred Pharmacy:  [id number] [place], [place] - [address] AT [facility] [address] & [facility] [address] Phone:  [phone]

## 2022-04-28 ENCOUNTER — Telehealth: Payer: Self-pay | Admitting: Internal Medicine

## 2022-04-28 NOTE — Telephone Encounter (Signed)
Patient is requesting a refill on her adderall xr - please send to [facility] on [address] and [address]

## 2022-04-29 MED ORDER — AMPHETAMINE-DEXTROAMPHET ER 20 MG PO CP24: 20.0000 mg | ORAL_CAPSULE | ORAL | 0 refills | Status: DC

## 2022-06-10 ENCOUNTER — Telehealth: Payer: Self-pay | Admitting: Internal Medicine

## 2022-06-10 NOTE — Telephone Encounter (Signed)
Patient needs her adderall xr called into [facility] on [address]

## 2022-06-13 MED ORDER — AMPHETAMINE-DEXTROAMPHET ER 20 MG PO CP24: 20.0000 mg | ORAL_CAPSULE | ORAL | 0 refills | Status: DC

## 2022-08-15 ENCOUNTER — Telehealth: Payer: Self-pay | Admitting: Internal Medicine

## 2022-08-15 MED ORDER — AMPHETAMINE-DEXTROAMPHET ER 20 MG PO CP24: 20.0000 mg | ORAL_CAPSULE | ORAL | 0 refills | Status: DC

## 2022-08-15 NOTE — Telephone Encounter (Signed)
Done erx 

## 2022-08-15 NOTE — Telephone Encounter (Signed)
LOV 12/09/21

## 2022-08-15 NOTE — Telephone Encounter (Signed)
Caller & Relationship to patient: Self  Call back number: [phone]   Date of last office visit: 4.13.23  Date of next office visit: 4.16.24  Medication(s) to be refilled:  amphetamine-dextroamphetamine (ADDERALL XR) 20 MG 24 hr capsule   Preferred Pharmacy:   [facility] #[id number]    Phone: [phone]  Fax: [phone]

## 2022-08-16 NOTE — Telephone Encounter (Signed)
Patient informed of refill via voicemail

## 2022-08-23 ENCOUNTER — Telehealth: Payer: Self-pay | Admitting: Internal Medicine

## 2022-08-23 NOTE — Telephone Encounter (Signed)
Patient was told by her pharmacy that they need a prior authorization sent in on Adderall XR - Please advise -

## 2022-08-24 NOTE — Telephone Encounter (Signed)
PA started, patient informed  Key: [id number]

## 2022-09-05 ENCOUNTER — Encounter: Payer: Self-pay | Admitting: Internal Medicine

## 2022-09-07 ENCOUNTER — Other Ambulatory Visit (HOSPITAL_COMMUNITY): Payer: Self-pay

## 2022-09-07 NOTE — Telephone Encounter (Signed)
Patient Advocate Encounter   Received notification from [place] that prior authorization for Adderall XR 20 mg capsules is required.   PA submitted on 09/07/2021 Key [id number] Status is pending

## 2022-09-09 ENCOUNTER — Other Ambulatory Visit (HOSPITAL_COMMUNITY): Payer: Self-pay

## 2022-09-09 NOTE — Telephone Encounter (Signed)
Pharmacy Patient Advocate Encounter  Prior Authorization for Adderall XR 20MG  ER Capsules has been approved.    PA# [id number] Effective dates: 09/08/2022 through 09/07/2025

## 2022-10-14 ENCOUNTER — Telehealth: Payer: Self-pay | Admitting: Internal Medicine

## 2022-10-14 NOTE — Telephone Encounter (Signed)
Pt called in requesting Rx amphetamine-dextroamphetamine (ADDERALL XR) 20 MG 24 hr capsule to be refilled  Please call pt with update.

## 2022-10-17 MED ORDER — AMPHETAMINE-DEXTROAMPHET ER 20 MG PO CP24: 20.0000 mg | ORAL_CAPSULE | ORAL | 0 refills | Status: DC

## 2022-10-17 NOTE — Telephone Encounter (Signed)
Done erx 

## 2022-11-21 ENCOUNTER — Telehealth: Payer: Self-pay | Admitting: Internal Medicine

## 2022-11-21 MED ORDER — AMPHETAMINE-DEXTROAMPHET ER 20 MG PO CP24: 20.0000 mg | ORAL_CAPSULE | ORAL | 0 refills | Status: DC

## 2022-11-21 NOTE — Telephone Encounter (Signed)
Prescription Request  11/21/2022  LOV: 12/09/2021  What is the name of the medication or equipment?  amphetamine-dextroamphetamine (ADDERALL XR) 20 MG 24 hr capsule  Have you contacted your pharmacy to request a refill? Yes   Which pharmacy would you like this sent to?  [id number] [place] [place], [address] AT [facility] [address] & [address] [place] [address] Phone: [phone] Fax: [phone]  Patient notified that their request is being sent to the clinical staff for review and that they should receive a response within 2 business days.   Please advise at Mobile [phone] (mobile)

## 2022-11-21 NOTE — Telephone Encounter (Signed)
Done erx 

## 2022-12-13 ENCOUNTER — Encounter: Payer: BC Managed Care – PPO | Admitting: Internal Medicine

## 2022-12-27 ENCOUNTER — Encounter: Payer: BC Managed Care – PPO | Admitting: Internal Medicine

## 2023-01-25 ENCOUNTER — Telehealth: Payer: Self-pay | Admitting: Internal Medicine

## 2023-01-25 MED ORDER — AMPHETAMINE-DEXTROAMPHET ER 20 MG PO CP24: 20.0000 mg | ORAL_CAPSULE | ORAL | 0 refills | Status: DC

## 2023-01-25 NOTE — Telephone Encounter (Signed)
Pt called for Rx amphetamine-dextroamphetamine (ADDERALL XR) 20 MG 24 hr capsule   I notice the Rx has ended pt need to be seen has upcoming appt June 11th. I wasn't sure about how to handle this please advise pt thanks

## 2023-02-07 ENCOUNTER — Encounter: Payer: BC Managed Care – PPO | Admitting: Internal Medicine

## 2023-05-09 ENCOUNTER — Other Ambulatory Visit: Payer: Self-pay | Admitting: Internal Medicine

## 2023-05-09 MED ORDER — AMPHETAMINE-DEXTROAMPHET ER 20 MG PO CP24: 20.0000 mg | ORAL_CAPSULE | ORAL | 0 refills | Status: DC

## 2023-05-24 ENCOUNTER — Ambulatory Visit (INDEPENDENT_AMBULATORY_CARE_PROVIDER_SITE_OTHER): Payer: BC Managed Care – PPO | Admitting: Internal Medicine

## 2023-05-24 VITALS — BP 110/78 | HR 75 | Temp 98.0°F | Ht 72.0 in | Wt 226.6 lb

## 2023-05-24 DIAGNOSIS — E785 Hyperlipidemia, unspecified: Secondary | ICD-10-CM

## 2023-05-24 DIAGNOSIS — R739 Hyperglycemia, unspecified: Secondary | ICD-10-CM | POA: Diagnosis not present

## 2023-05-24 DIAGNOSIS — Z0000 Encounter for general adult medical examination without abnormal findings: Secondary | ICD-10-CM | POA: Diagnosis not present

## 2023-05-24 DIAGNOSIS — E538 Deficiency of other specified B group vitamins: Secondary | ICD-10-CM

## 2023-05-24 DIAGNOSIS — F988 Other specified behavioral and emotional disorders with onset usually occurring in childhood and adolescence: Secondary | ICD-10-CM | POA: Diagnosis not present

## 2023-05-24 DIAGNOSIS — Z0001 Encounter for general adult medical examination with abnormal findings: Secondary | ICD-10-CM

## 2023-05-24 DIAGNOSIS — F5101 Primary insomnia: Secondary | ICD-10-CM | POA: Diagnosis not present

## 2023-05-24 DIAGNOSIS — F411 Generalized anxiety disorder: Secondary | ICD-10-CM

## 2023-05-24 DIAGNOSIS — E559 Vitamin D deficiency, unspecified: Secondary | ICD-10-CM

## 2023-05-24 MED ORDER — TRAZODONE HCL 50 MG PO TABS: 25.0000 mg | ORAL_TABLET | Freq: Every evening | ORAL | 1 refills | Status: DC | PRN

## 2023-05-24 MED ORDER — AMPHETAMINE-DEXTROAMPHET ER 20 MG PO CP24: 20.0000 mg | ORAL_CAPSULE | ORAL | 0 refills | Status: DC

## 2023-05-24 NOTE — Patient Instructions (Addendum)
Please take all new medication as prescribed - the trazodone for sleep as needed  Please continue all other medications as before, and refills have been done if requested - the adderall  Please have the pharmacy call with any other refills you may need.  Please continue your efforts at being more active, low cholesterol diet, and weight control.  You are otherwise up to date with prevention measures today.  Please keep your appointments with your specialists as you may have planned  Please go to the LAB at the blood drawing area for the tests to be done  You will be contacted by phone if any changes need to be made immediately.  Otherwise, you will receive a letter about your results with an explanation, but please check with MyChart first.  Please make an Appointment to return for your 1 year visit, or sooner if needed

## 2023-05-27 ENCOUNTER — Encounter: Payer: Self-pay | Admitting: Internal Medicine

## 2023-05-27 DIAGNOSIS — G4700 Insomnia, unspecified: Secondary | ICD-10-CM | POA: Insufficient documentation

## 2023-05-27 NOTE — Assessment & Plan Note (Signed)
Age and sex appropriate education and counseling updated with regular exercise and diet Referrals for preventative services - pt to call for gyn pap soon Immunizations addressed - for tdap at the pharmacy Smoking counseling  - none needed Evidence for depression or other mood disorder - none significant Most recent labs reviewed. I have personally reviewed and have noted: 1) the patient's medical and social history 2) The patient's current medications and supplements 3) The patient's height, weight, and BMI have been recorded in the chart

## 2023-05-27 NOTE — Assessment & Plan Note (Signed)
Worsening recent, for trazodone 50 at bedtime prn

## 2023-05-27 NOTE — Assessment & Plan Note (Signed)
Stable overall, to continue adderall

## 2023-05-27 NOTE — Assessment & Plan Note (Signed)
Stable overall, declines need for other med tx

## 2023-05-27 NOTE — Assessment & Plan Note (Signed)
Last vitamin D ?Lab Results  ?Component Value Date  ? VD25OH 28.92 (L) 12/09/2021  ? ?Low, to start oral replacement ?

## 2023-05-27 NOTE — Assessment & Plan Note (Signed)
Lab Results  Component Value Date   LDLCALC 125 (H) 12/09/2021   Uncontrolled, pt for low chol diet, declines statin

## 2023-06-15 ENCOUNTER — Telehealth: Payer: Self-pay | Admitting: Internal Medicine

## 2023-06-15 MED ORDER — AMPHETAMINE-DEXTROAMPHET ER 20 MG PO CP24: 20.0000 mg | ORAL_CAPSULE | ORAL | 0 refills | Status: DC

## 2023-06-15 NOTE — Telephone Encounter (Signed)
Done erx 

## 2023-06-15 NOTE — Telephone Encounter (Signed)
amphetamine-dextroamphetamine (ADDERALL XR) 20 MG 24 hr capsule [[id number]]  Pt states this medication is on back order for a lot of pharmacies. She will like hers sent to a different [facility] which [facility] on [address] [place], [place], [place] [address].

## 2023-06-19 ENCOUNTER — Other Ambulatory Visit: Payer: Self-pay | Admitting: Internal Medicine

## 2023-06-19 MED ORDER — AMPHETAMINE-DEXTROAMPHET ER 20 MG PO CP24: 20.0000 mg | ORAL_CAPSULE | ORAL | 0 refills | Status: DC

## 2023-07-17 ENCOUNTER — Telehealth: Payer: Self-pay | Admitting: Internal Medicine

## 2023-07-17 MED ORDER — AMPHETAMINE-DEXTROAMPHET ER 20 MG PO CP24: 20.0000 mg | ORAL_CAPSULE | ORAL | 0 refills | Status: DC

## 2023-07-17 NOTE — Telephone Encounter (Signed)
Done erx 

## 2023-07-17 NOTE — Telephone Encounter (Signed)
Prescription Request  07/17/2023  LOV: 05/24/2023  What is the name of the medication or equipment? amphetamine-dextroamphetamine (ADDERALL XR) 20 MG 24 hr capsule   Have you contacted your pharmacy to request a refill? No   Which pharmacy would you like this sent to?   [id number] [place], [place] - [address] AT [facility] [address] & [facility] [address] [place] [address] Phone: [phone] Fax: [phone]  Patient notified that their request is being sent to the clinical staff for review and that they should receive a response within 2 business days.   Please advise at Mobile [phone] (mobile)

## 2023-08-17 ENCOUNTER — Other Ambulatory Visit: Payer: Self-pay | Admitting: Internal Medicine

## 2023-08-17 MED ORDER — AMPHETAMINE-DEXTROAMPHET ER 20 MG PO CP24: 20.0000 mg | ORAL_CAPSULE | ORAL | 0 refills | Status: DC

## 2023-10-12 ENCOUNTER — Other Ambulatory Visit: Payer: Self-pay | Admitting: Internal Medicine

## 2023-10-12 ENCOUNTER — Encounter: Payer: Self-pay | Admitting: Internal Medicine

## 2023-10-12 MED ORDER — AMPHETAMINE-DEXTROAMPHET ER 20 MG PO CP24: 20.0000 mg | ORAL_CAPSULE | ORAL | 0 refills | Status: DC

## 2023-10-12 MED ORDER — MELOXICAM 15 MG PO TABS: 15.0000 mg | ORAL_TABLET | Freq: Every day | ORAL | 2 refills | Status: AC

## 2023-11-24 ENCOUNTER — Other Ambulatory Visit: Payer: Self-pay | Admitting: Internal Medicine

## 2023-11-27 ENCOUNTER — Other Ambulatory Visit: Payer: Self-pay

## 2023-12-19 ENCOUNTER — Other Ambulatory Visit: Payer: Self-pay | Admitting: Internal Medicine

## 2023-12-19 MED ORDER — AMPHETAMINE-DEXTROAMPHET ER 20 MG PO CP24: 20.0000 mg | ORAL_CAPSULE | ORAL | 0 refills | Status: DC

## 2024-01-18 ENCOUNTER — Encounter: Payer: Self-pay | Admitting: Internal Medicine

## 2024-01-18 MED ORDER — AMPHETAMINE-DEXTROAMPHET ER 20 MG PO CP24
20.0000 mg | ORAL_CAPSULE | ORAL | 0 refills | Status: DC
Start: 2024-01-18 — End: 2024-03-14

## 2024-03-13 ENCOUNTER — Encounter: Payer: Self-pay | Admitting: Internal Medicine

## 2024-03-14 MED ORDER — AMPHETAMINE-DEXTROAMPHET ER 20 MG PO CP24
20.0000 mg | ORAL_CAPSULE | ORAL | 0 refills | Status: DC
Start: 2024-03-14 — End: 2024-05-03

## 2024-05-02 ENCOUNTER — Encounter: Payer: Self-pay | Admitting: Internal Medicine

## 2024-05-03 MED ORDER — AMPHETAMINE-DEXTROAMPHET ER 20 MG PO CP24: 20.0000 mg | ORAL_CAPSULE | ORAL | 0 refills | Status: DC

## 2024-05-15 MED ORDER — AMPHETAMINE-DEXTROAMPHET ER 20 MG PO CP24: 20.0000 mg | ORAL_CAPSULE | ORAL | 0 refills | Status: DC

## 2024-05-15 NOTE — Addendum Note (Signed)
 Addended by: NORLEEN LYNWOOD ORN on: 05/15/2024 03:43 PM   Modules accepted: Orders

## 2024-06-13 MED ORDER — AMPHETAMINE-DEXTROAMPHET ER 20 MG PO CP24: 20.0000 mg | ORAL_CAPSULE | ORAL | 0 refills | Status: DC

## 2024-06-13 NOTE — Addendum Note (Signed)
 Addended by: NORLEEN LYNWOOD ORN on: 06/13/2024 02:54 PM   Modules accepted: Orders

## 2024-07-23 ENCOUNTER — Encounter: Payer: Self-pay | Admitting: Internal Medicine

## 2024-07-24 MED ORDER — AMPHETAMINE-DEXTROAMPHET ER 20 MG PO CP24: 20.0000 mg | ORAL_CAPSULE | ORAL | 0 refills | Status: DC

## 2024-08-20 ENCOUNTER — Encounter: Payer: Self-pay | Admitting: Internal Medicine

## 2024-08-20 ENCOUNTER — Ambulatory Visit: Admitting: Internal Medicine

## 2024-08-20 ENCOUNTER — Ambulatory Visit: Payer: Self-pay | Admitting: Internal Medicine

## 2024-08-20 VITALS — BP 122/78 | HR 70 | Temp 97.6°F | Ht 72.0 in | Wt 213.0 lb

## 2024-08-20 DIAGNOSIS — F5101 Primary insomnia: Secondary | ICD-10-CM

## 2024-08-20 DIAGNOSIS — Z1231 Encounter for screening mammogram for malignant neoplasm of breast: Secondary | ICD-10-CM

## 2024-08-20 DIAGNOSIS — L710 Perioral dermatitis: Secondary | ICD-10-CM | POA: Insufficient documentation

## 2024-08-20 DIAGNOSIS — E785 Hyperlipidemia, unspecified: Secondary | ICD-10-CM | POA: Diagnosis not present

## 2024-08-20 DIAGNOSIS — R739 Hyperglycemia, unspecified: Secondary | ICD-10-CM

## 2024-08-20 DIAGNOSIS — F988 Other specified behavioral and emotional disorders with onset usually occurring in childhood and adolescence: Secondary | ICD-10-CM | POA: Diagnosis not present

## 2024-08-20 DIAGNOSIS — E559 Vitamin D deficiency, unspecified: Secondary | ICD-10-CM | POA: Diagnosis not present

## 2024-08-20 DIAGNOSIS — Z859 Personal history of malignant neoplasm, unspecified: Secondary | ICD-10-CM | POA: Insufficient documentation

## 2024-08-20 DIAGNOSIS — E538 Deficiency of other specified B group vitamins: Secondary | ICD-10-CM | POA: Diagnosis not present

## 2024-08-20 DIAGNOSIS — Z0001 Encounter for general adult medical examination with abnormal findings: Secondary | ICD-10-CM

## 2024-08-20 DIAGNOSIS — Z0000 Encounter for general adult medical examination without abnormal findings: Secondary | ICD-10-CM | POA: Diagnosis not present

## 2024-08-20 LAB — LIPID PANEL
Cholesterol: 160 mg/dL (ref 28–200)
HDL: 58.3 mg/dL
LDL Cholesterol: 87 mg/dL (ref 10–99)
NonHDL: 101.21
Total CHOL/HDL Ratio: 3
Triglycerides: 72 mg/dL (ref 10.0–149.0)
VLDL: 14.4 mg/dL (ref 0.0–40.0)

## 2024-08-20 LAB — CBC WITH DIFFERENTIAL/PLATELET
Basophils Absolute: 0.1 K/uL (ref 0.0–0.1)
Basophils Relative: 1.1 % (ref 0.0–3.0)
Eosinophils Absolute: 0.1 K/uL (ref 0.0–0.7)
Eosinophils Relative: 1.7 % (ref 0.0–5.0)
HCT: 38.5 % (ref 36.0–46.0)
Hemoglobin: 13 g/dL (ref 12.0–15.0)
Lymphocytes Relative: 48.9 % — ABNORMAL HIGH (ref 12.0–46.0)
Lymphs Abs: 2.9 K/uL (ref 0.7–4.0)
MCHC: 33.8 g/dL (ref 30.0–36.0)
MCV: 87.7 fl (ref 78.0–100.0)
Monocytes Absolute: 0.5 K/uL (ref 0.1–1.0)
Monocytes Relative: 9.2 % (ref 3.0–12.0)
Neutro Abs: 2.3 K/uL (ref 1.4–7.7)
Neutrophils Relative %: 39.1 % — ABNORMAL LOW (ref 43.0–77.0)
Platelets: 297 K/uL (ref 150.0–400.0)
RBC: 4.39 Mil/uL (ref 3.87–5.11)
RDW: 12.2 % (ref 11.5–15.5)
WBC: 5.9 K/uL (ref 4.0–10.5)

## 2024-08-20 LAB — URINALYSIS, ROUTINE W REFLEX MICROSCOPIC
Bilirubin Urine: NEGATIVE
Hgb urine dipstick: NEGATIVE
Ketones, ur: NEGATIVE
Leukocytes,Ua: NEGATIVE
Nitrite: NEGATIVE
RBC / HPF: NONE SEEN
Specific Gravity, Urine: 1.02 (ref 1.000–1.030)
Total Protein, Urine: NEGATIVE
Urine Glucose: NEGATIVE
Urobilinogen, UA: 0.2 (ref 0.0–1.0)
pH: 6.5 (ref 5.0–8.0)

## 2024-08-20 LAB — HEPATIC FUNCTION PANEL
ALT: 14 U/L (ref 3–35)
AST: 20 U/L (ref 5–37)
Albumin: 4.3 g/dL (ref 3.5–5.2)
Alkaline Phosphatase: 47 U/L (ref 39–117)
Bilirubin, Direct: 0.1 mg/dL (ref 0.1–0.3)
Total Bilirubin: 0.4 mg/dL (ref 0.2–1.2)
Total Protein: 7 g/dL (ref 6.0–8.3)

## 2024-08-20 LAB — BASIC METABOLIC PANEL WITH GFR
BUN: 9 mg/dL (ref 6–23)
CO2: 29 meq/L (ref 19–32)
Calcium: 9.1 mg/dL (ref 8.4–10.5)
Chloride: 104 meq/L (ref 96–112)
Creatinine, Ser: 0.67 mg/dL (ref 0.40–1.20)
GFR: 106.67 mL/min
Glucose, Bld: 82 mg/dL (ref 70–99)
Potassium: 4.2 meq/L (ref 3.5–5.1)
Sodium: 139 meq/L (ref 135–145)

## 2024-08-20 LAB — HEMOGLOBIN A1C: Hgb A1c MFr Bld: 5.7 % (ref 4.6–6.5)

## 2024-08-20 LAB — TSH: TSH: 1.59 u[IU]/mL (ref 0.35–5.50)

## 2024-08-20 LAB — VITAMIN D 25 HYDROXY (VIT D DEFICIENCY, FRACTURES): VITD: 24.32 ng/mL — ABNORMAL LOW (ref 30.00–100.00)

## 2024-08-20 LAB — VITAMIN B12: Vitamin B-12: 570 pg/mL (ref 211–911)

## 2024-08-20 MED ORDER — TRAZODONE HCL 50 MG PO TABS: 50.0000 mg | ORAL_TABLET | Freq: Every evening | ORAL | 1 refills | Status: AC | PRN

## 2024-08-20 MED ORDER — AMPHETAMINE-DEXTROAMPHET ER 20 MG PO CP24: 20.0000 mg | ORAL_CAPSULE | ORAL | 0 refills | Status: AC

## 2024-08-20 NOTE — Patient Instructions (Signed)
 Please continue all other medications as before, and refills have been done if requested.  Please have the pharmacy call with any other refills you may need.  Please continue your efforts at being more active, low cholesterol diet, and weight control.  You are otherwise up to date with prevention measures today.  Please keep your appointments with your specialists as you may have planned  You will be contacted regarding the referral for: Mammogram  Please go to the LAB at the blood drawing area for the tests to be done  You will be contacted by phone if any changes need to be made immediately.  Otherwise, you will receive a letter about your results with an explanation, but please check with MyChart first.  Please make an Appointment to return in 6 months, or sooner if needed

## 2024-08-20 NOTE — Assessment & Plan Note (Signed)
 Stable , cont adderal asd

## 2024-08-20 NOTE — Assessment & Plan Note (Signed)
 Age and sex appropriate education and counseling updated with regular exercise and diet Referrals for preventative services - none needed Immunizations addressed - for tdap, hep b, and HPV vax at pharmacy Smoking counseling  - none needed Evidence for depression or other mood disorder - none significant Most recent labs reviewed. I have personally reviewed and have noted: 1) the patient's medical and social history 2) The patient's current medications and supplements 3) The patient's height, weight, and BMI have been recorded in the chart

## 2024-08-20 NOTE — Assessment & Plan Note (Signed)
 Lab Results  Component Value Date   LDLCALC 125 (H) 12/09/2021   uncontrolled, pt for lower chol diet, declines statin or other today, for f/u lab as well

## 2024-08-20 NOTE — Assessment & Plan Note (Signed)
 Stable, continue trazodone  at bedtime prn

## 2024-08-20 NOTE — Progress Notes (Signed)
 Patient ID: Alexis Hoover, female   DOB: 16-Oct-1980, 43 y.o.   MRN: [id number]         Chief Complaint:: wellness exam and ADD, low vit d, insomnia, hld       HPI:  Alexis Hoover is a 43 y.o. female here for wellness exam; to see GYN nsoon, for tdap, hep B and HPV vax at pharmacy, due for mammogram, o/w up to date                         Also Pt denies chest pain, increased sob or doe, wheezing, orthopnea, PND, increased LE swelling, palpitations, dizziness or syncope.   Pt denies polydipsia, polyuria, or new focal neuro s/s.    Pt denies fever, wt loss, night sweats, loss of appetite, or other constitutional symptoms     Wt Readings from Last 3 Encounters:  08/20/24 213 lb (96.6 kg)  05/24/23 226 lb 9.6 oz (102.8 kg)  12/09/21 233 lb (105.7 kg)   BP Readings from Last 3 Encounters:  08/20/24 122/78  05/24/23 110/78  12/09/21 102/66   Immunization History  Administered Date(s) Administered   Influenza, Seasonal, Injecte, Preservative Fre 06/29/2013   Influenza-Unspecified 07/05/2018   PFIZER Comirnaty(Gray Top)Covid-19 Tri-Sucrose Vaccine 10/29/2019, 11/02/2019   Tdap 04/16/2011   Unspecified SARS-COV-2 Vaccination 10/28/2018   Health Maintenance Due  Topic Date Due   Hepatitis B Vaccines 19-59 Average Risk (1 of 3 - 19+ 3-dose series) Never done   HPV VACCINES (1 - Risk 3-dose SCDM series) Never done   Cervical Cancer Screening (HPV/Pap Cotest)  10/17/2017   Mammogram  09/18/2020   DTaP/Tdap/Td (2 - Td or Tdap) 04/15/2021      Past Medical History:  Diagnosis Date   Abnormal Pap smear    LEEP   Lumbar disc disease 04/19/2017   No pertinent past medical history    Past Surgical History:  Procedure Laterality Date   LEEP      reports that she has never smoked. She has never used smokeless tobacco. She reports current alcohol use. She reports that she does not use drugs. family history includes Asthma in her sister; Cancer in her paternal grandfather; Diabetes in  her maternal grandmother; Hypertension in her mother; Stroke in her maternal grandmother; Vision loss in her maternal grandmother. Allergies[1] Medications Ordered Prior to Encounter[2]      ROS:  All others reviewed and negative.  Objective        PE:  BP 122/78 (BP Location: Left Arm, Patient Position: Sitting, Cuff Size: Normal)   Pulse 70   Temp 97.6 F (36.4 C) (Oral)   Ht 6' (1.829 m)   Wt 213 lb (96.6 kg)   LMP  (LMP Unknown)   SpO2 100%   BMI 28.89 kg/m                 Constitutional: Pt appears in NAD               HENT: Head: NCAT.                Right Ear: External ear normal.                 Left Ear: External ear normal.                Eyes: . Pupils are equal, round, and reactive to light. Conjunctivae and EOM are normal  Nose: without d/c or deformity               Neck: Neck supple. Gross normal ROM               Cardiovascular: Normal rate and regular rhythm.                 Pulmonary/Chest: Effort normal and breath sounds without rales or wheezing.                Abd:  Soft, NT, ND, + BS, no organomegaly               Neurological: Pt is alert. At baseline orientation, motor grossly intact               Skin: Skin is warm. No rashes, no other new lesions, LE edema - none               Psychiatric: Pt behavior is normal without agitation   Micro: none  Cardiac tracings I have personally interpreted today:  none  Pertinent Radiological findings (summarize): none   Lab Results  Component Value Date   WBC 5.9 08/20/2024   HGB 13.0 08/20/2024   HCT 38.5 08/20/2024   PLT 297.0 08/20/2024   GLUCOSE 82 08/20/2024   CHOL 160 08/20/2024   TRIG 72.0 08/20/2024   HDL 58.30 08/20/2024   LDLCALC 87 08/20/2024   ALT 14 08/20/2024   AST 20 08/20/2024   NA 139 08/20/2024   K 4.2 08/20/2024   CL 104 08/20/2024   CREATININE 0.67 08/20/2024   BUN 9 08/20/2024   CO2 29 08/20/2024   TSH 1.59 08/20/2024   HGBA1C 5.7 08/20/2024   Assessment/Plan:   Alexis Hoover is a 43 y.o. White or Caucasian [1] female with  has a past medical history of Abnormal Pap smear, Lumbar disc disease (04/19/2017), and No pertinent past medical history.  Encounter for well adult exam with abnormal findings Age and sex appropriate education and counseling updated with regular exercise and diet Referrals for preventative services - none needed Immunizations addressed - for tdap, hep b, and HPV vax at pharmacy Smoking counseling  - none needed Evidence for depression or other mood disorder - none significant Most recent labs reviewed. I have personally reviewed and have noted: 1) the patient's medical and social history 2) The patient's current medications and supplements 3) The patient's height, weight, and BMI have been recorded in the chart   Vitamin D  deficiency Last vitamin D  Lab Results  Component Value Date   VD25OH 28.92 (L) 12/09/2021   Low, to start oral replacement   Insomnia Stable, continue trazodone  at bedtime prn  HLD (hyperlipidemia) Lab Results  Component Value Date   LDLCALC 125 (H) 12/09/2021   uncontrolled, pt for lower chol diet, declines statin or other today, for f/u lab as well    Attention deficit disorder Stable , cont adderal asd  Followup: Return in about 6 months (around 02/18/2025).  Alexis Rush, MD 08/20/2024 12:39 PM [facility] Internal Medicine     [1] No Known Allergies [2]  Current Outpatient Medications on File Prior to Visit  Medication Sig Dispense Refill   meloxicam  (MOBIC ) 15 MG tablet Take 1 tablet (15 mg total) by mouth daily. 30 tablet 2   norgestimate-ethinyl estradiol  (ORTHO-CYCLEN) 0.25-35 MG-MCG tablet 0.25 mg.     No current facility-administered medications on file prior to visit.

## 2024-08-20 NOTE — Assessment & Plan Note (Signed)
Last vitamin D ?Lab Results  ?Component Value Date  ? VD25OH 28.92 (L) 12/09/2021  ? ?Low, to start oral replacement ?
